# Patient Record
Sex: Female | Born: 1965 | Race: Black or African American | Hispanic: No | Marital: Married | Smoking: Never smoker
Health system: Southern US, Community
[De-identification: ages and names within clinical notes are randomized; demographics above are authoritative.]

## PROBLEM LIST (undated history)

## (undated) DIAGNOSIS — N952 Postmenopausal atrophic vaginitis: Secondary | ICD-10-CM

## (undated) DIAGNOSIS — I10 Essential (primary) hypertension: Secondary | ICD-10-CM

## (undated) HISTORY — DX: Postmenopausal atrophic vaginitis: N95.2

## (undated) HISTORY — PX: ABDOMINAL HYSTERECTOMY: SHX81

## (undated) HISTORY — DX: Essential (primary) hypertension: I10

## (undated) HISTORY — PX: ROUX-EN-Y GASTRIC BYPASS: SHX1104

## (undated) HISTORY — PX: OTHER SURGICAL HISTORY: SHX169

---

## 2014-05-19 ENCOUNTER — Ambulatory Visit: Payer: Self-pay | Admitting: *Deleted

## 2014-05-28 ENCOUNTER — Encounter: Payer: BC Managed Care – PPO | Attending: Family Medicine | Admitting: Dietician

## 2014-05-28 ENCOUNTER — Encounter: Payer: Self-pay | Admitting: Dietician

## 2014-05-28 VITALS — Ht 64.0 in | Wt 220.3 lb

## 2014-05-28 DIAGNOSIS — Z713 Dietary counseling and surveillance: Secondary | ICD-10-CM | POA: Diagnosis not present

## 2014-05-28 DIAGNOSIS — E669 Obesity, unspecified: Secondary | ICD-10-CM | POA: Diagnosis present

## 2014-05-28 DIAGNOSIS — Z9884 Bariatric surgery status: Secondary | ICD-10-CM | POA: Diagnosis not present

## 2014-05-28 NOTE — Progress Notes (Signed)
Medical Nutrition Therapy:  Appt start time: 1400 end time:  1530.  Assessment:  Primary concerns today: Tiffany Griffith is here today reporting that her "eating habits are horrible." [place] Her highest weight was 286 lbs prior to surgery. Has a strong family history of HTN. She reports a lack of support at her surgery program and "got stuck eating the same things" after surgery and returned to old habits. However, she did lose over 100 pounds. She reports that she became anxious and fearful after she lost the weight. She saw a counselor previously but has not been to one recently. Her goal is to get down to 170-180 pounds or a size 10. Tiffany Griffith reports that her husband is obese as well and is not supportive or motivating. 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 Tiffany Griffith states that she never feels full and snacks constantly.       Preferred Learning Style:  No preference indicated   Learning Readiness:  Ready   MEDICATIONS: see list   DIETARY INTAKE:  Usual eating pattern includes 3 meals and several snacks "all day". Avoided foods include ice cream.    24-hr recall:  B ( AM): 2 eggs, 2 pieces of toast, [place] bacon  Snk ( AM):  L ( PM): leftovers Snk ( PM): popcorn, potato chips, candy D ( PM): chicken, chicken salad, grilled cheese sandwich Snk ( PM):   Beverages: water with sugar free flavoring, powerade zero, sweet tea occasionally  Usual physical activity: none recently  Estimated energy needs: 1400-1600 calories 158-170 g carbohydrates 105-112 g protein 39-42 g fat  Progress Towards Goal(s):  In progress.   Nutritional Diagnosis:  [place]-3.3 Overweight/obesity As related to knowledge deficit and noncompliance regarding post op bariatric surgery diet guidelines.  As evidenced by BMI of 38 and patient report of poor eating habits.    Intervention:  Nutrition counseling provided. Discussed basic nutrition and exercise recommendations. 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 Recommended increasing protein intake and limiting carbohydrate intake.   Samples provided and patient educated on proper usage:  Premier protein shake (strawberry - qty 1) Lot #: [id number] Exp: 10/2014  Premier protein shake (chocolate - qty 1) Lot #: [id number] Exp: 11/2014   Goals: -Look into a counselor  -Talk to counselor about stress eating -Stress management: exercising, church/praying, work on Therapist, musicstuff for [employer] -Reward yourself with non-food items -Remove your "trigger foods" from the house -Get back into exercise routine  -Try using My Fitness Pal again -Try to get at least 60 grams of protein per day  -Cottage cheese, eggs, protein shakes, [place] yogurt, lean meats (baked/grilled chicken, fish)  -1 ounces of meat = 7 grams of protein  -3 ounces of meat is about the size of your palm -Avoid weighing yourself every day -Get back into taking supplements  -"Complete" multivitamin  -1500 milligrams Calcium citrate (500 mg 3x a day) - Citracal Petites   -Vitamin B12 "sublingual" (under the tongue) -Limit starches: potatoes, corn, bread, stuffing, peas, rice -Fill up on non-starchy vegetables (any vegetable that is not corn, peas, or potatoes)  -Vegetables are good raw or cooked, fresh or frozen -Choose foods that are higher in fiber and lower in sugar Breakfast:  -2 eggs, [place] bacon, and 1 piece of toast  -Low-sugar oatmeal with nuts   -Fat free Greek yogurt with whole grain cereal sprinkled  on top Lunch and dinner:  -Stir-fried vegetables and meat  -Fish/meat and vegetables  -Low-carb taco salad with ground [place] with [place] yogurt, salsa, beans, lettuce, tomatoes, and cheese (no tortilla)  -BBQ chicken nachos without the chip (or pre-portioned chips)   Snacks:  -Protein shakes  -Vegetables   -String  cheese  -Cottage cheese  -[employer] Protein bars *Refer to snack list *Keep healthy snacks readily available   Teaching Method Utilized:  Visual Auditory Hands on  Handouts given during visit include:  Counseling services  MyPlate  15g CHO + protein snacks  Phase 3A lean protein foods  Phase 3B lean protein + non-starchy vegetables  Barriers to learning/adherence to lifestyle change: lack of family support, knowledge deficit  Demonstrated degree of understanding via:  Teach Back   Monitoring/Evaluation:  Dietary intake, exercise, and body weight in 6 week(s).

## 2014-05-28 NOTE — Patient Instructions (Addendum)
-  Look into a counselor  -Talk to counselor about stress eating  -Stress management: exercising, church/praying, work on Therapist, musicstuff for flea market  -Reward yourself with non-food items  -Remove your "trigger foods" from the house  -Get back into exercise routine  -Try using My Fitness Pal again  -Try to get at least 60 grams of protein per day  -Cottage cheese, eggs, protein shakes, [place] yogurt, lean meats (baked/grilled chicken, fish)  -1 ounces of meat = 7 grams of protein  -3 ounces of meat is about the size of your palm  -Avoid weighing yourself every day  -Get back into taking supplements  -"Complete" multivitamin  -1500 milligrams Calcium citrate (500 mg 3x a day) - Citracal Petites   -Vitamin B12 "sublingual" (under the tongue)  -Limit starches: potatoes, corn, bread, stuffing, peas, rice  -Fill up on non-starchy vegetables (any vegetable that is not corn, peas, or potatoes)  -Vegetables are good raw or cooked, fresh or frozen  -Choose foods that are higher in fiber and lower in sugar  Breakfast:  -2 eggs, [place] bacon, and 1 piece of toast  -Low-sugar oatmeal with nuts   -Fat free Greek yogurt with whole grain cereal sprinkled on top  Lunch and dinner:  -Stir-fried vegetables and meat  -Fish/meat and vegetables  -Low-carb taco salad with ground [place] with [place] yogurt, salsa, beans, lettuce, tomatoes, and cheese (no tortilla)  -BBQ chicken nachos without the chip (or pre-portioned chips)    Snacks:  -Protein shakes  -Vegetables   -String cheese  -Cottage cheese  -[employer] Protein bars  *Refer to snack list *Keep healthy snacks readily available

## 2014-07-03 ENCOUNTER — Encounter: Payer: BC Managed Care – PPO | Attending: Family Medicine | Admitting: Dietician

## 2014-07-03 VITALS — Ht 64.0 in | Wt 217.0 lb

## 2014-07-03 DIAGNOSIS — Z9884 Bariatric surgery status: Secondary | ICD-10-CM | POA: Insufficient documentation

## 2014-07-03 DIAGNOSIS — Z713 Dietary counseling and surveillance: Secondary | ICD-10-CM | POA: Diagnosis not present

## 2014-07-03 DIAGNOSIS — E669 Obesity, unspecified: Secondary | ICD-10-CM | POA: Insufficient documentation

## 2014-07-03 NOTE — Progress Notes (Signed)
  Medical Nutrition Therapy:  Appt start time: 1150 end time:  1220.  Follow up:     Tiffany Griffith returns today having lost 4 pounds since her last visit 6 weeks ago. She reports that she is eating more vegetables and meat, and less carbs. Tiffany Griffith also reports more structure with eating. However, she got stressed in the last few weeks and has had some pizza and candy. She tried Premier protein shakes and did not like them. She has started taking multivitamin and calcium supplements again! Her husband is still keeping trigger foods in the house.   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 Avoided foods include ice cream.    24-hr recall:  B ( AM): 2 eggs, 1 piece toast, [place] bacon  Snk ( AM): fruit with whipped cream L ( PM): salad with chicken Snk ( PM): cheese D ( PM): baked fish with side salad Snk ( PM): yogurt with oatmeal and cottage cheese and whipped cream  Beverages: water with sugar free flavoring, powerade zero, sweet tea when she goes out to eat  Usual physical activity: none recently  Estimated energy needs: 1400-1600 calories 158-170 g carbohydrates 105-112 g protein 39-42 g fat  Progress Towards Goal(s):  In progress.   Nutritional Diagnosis:  [place]-3.3 Overweight/obesity As related to knowledge deficit and noncompliance regarding post op bariatric surgery diet guidelines.  As evidenced by BMI of 38 and patient report of poor eating habits.    Intervention: Nutrition counseling provided. Discussed basic nutrition and exercise recommendations. 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 Recommended increasing  protein intake and limiting carbohydrate intake.   Teaching Method Utilized:  Auditory  Handouts given during visit include:  Support group flyers  Barriers to learning/adherence to lifestyle change: lack of family support, knowledge deficit  Demonstrated degree of understanding via:  Teach Back   Monitoring/Evaluation:  Dietary intake, exercise, and body weight in 2 months.

## 2014-07-03 NOTE — Patient Instructions (Addendum)
-  Look into a counselor  -Talk to counselor about stress eating  -Stress management: exercising, church/praying, work on Therapist, musicstuff for flea market  -Reward yourself with non-food items  -Remove your "trigger foods" from the house  -Try Quest protein chips  -Try cutting sweet tea with 1/2 unsweet   -Get back into exercise routine  -Try using My Fitness Pal again  -Try to get at least 60 grams of protein per day  -Cottage cheese, eggs, protein shakes, [place] yogurt, lean meats (baked/grilled chicken, fish)  -1 ounces of meat = 7 grams of protein  -3 ounces of meat is about the size of your palm  -Avoid weighing yourself every day  -Limit starches: potatoes, corn, bread, stuffing, peas, rice  -Fill up on non-starchy vegetables (any vegetable that is not corn, peas, or potatoes)  -Vegetables are good raw or cooked, fresh or frozen  -Choose foods that are higher in fiber and lower in sugar  Breakfast:  -2 eggs, [place] bacon, and 1 piece of toast  -Low-sugar oatmeal with nuts   -Fat free Greek yogurt with whole grain cereal sprinkled on top  Lunch and dinner:  -Stir-fried vegetables and meat  -Fish/meat and vegetables  -Low-carb taco salad with ground [place] with [place] yogurt, salsa, beans, lettuce, tomatoes, and cheese (no tortilla)  -BBQ chicken nachos without the chip (or pre-portioned chips)    Snacks:  -Protein shakes  -Vegetables   -String cheese  -Cottage cheese  -[employer] Protein bars  *Refer to snack list *Keep healthy snacks readily available

## 2014-09-03 ENCOUNTER — Encounter: Payer: BC Managed Care – PPO | Attending: Family Medicine | Admitting: Dietician

## 2014-09-03 VITALS — Ht 64.0 in | Wt 218.5 lb

## 2014-09-03 DIAGNOSIS — Z713 Dietary counseling and surveillance: Secondary | ICD-10-CM | POA: Insufficient documentation

## 2014-09-03 DIAGNOSIS — Z6837 Body mass index (BMI) 37.0-37.9, adult: Secondary | ICD-10-CM | POA: Insufficient documentation

## 2014-09-03 DIAGNOSIS — E669 Obesity, unspecified: Secondary | ICD-10-CM | POA: Diagnosis present

## 2014-09-03 NOTE — Patient Instructions (Addendum)
-  Stress management: exercising, church/praying, work on Therapist, [employer]stuff for [employer]  -Reward yourself with non-food items  -Remove your "trigger foods" from the house  -Try Quest protein chips  -Try cutting sweet tea with 1/2 unsweet   -Get back into exercise routine  -Try using My Fitness Pal again  -Try to get at least 60 grams of protein per day  -Cottage cheese, eggs, protein shakes, [place] yogurt, lean meats (baked/grilled chicken, fish)  -1 ounces of meat = 7 grams of protein  -3 ounces of meat is about the size of your palm  -Avoid weighing yourself every day  -Limit starches: potatoes, corn, bread, stuffing, peas, rice  -Fill up on non-starchy vegetables (any vegetable that is not corn, peas, or potatoes)  -Vegetables are good raw or cooked, fresh or frozen  -Choose foods that are higher in fiber and lower in sugar  Meals: -Focus on lean meat and non-starchy vegetables  -2 eggs, [place] bacon, and 1 piece of toast   -Fat free Greek yogurt  -Cottage cheese  -Tuna/egg/chicken salad with lite mayo (add cheese for a melt)  -Stir-fried vegetables and meat  -Fish/meat and vegetables  -Low-carb taco salad with ground [place] with [place] yogurt, salsa, beans, lettuce, tomatoes, and cheese  -BBQ chicken nachos without the chip (or pre-portioned chips)    Snacks:  -Protein shakes  -Vegetables   -String cheese  -Cottage cheese  -[employer] Protein bars  *Keep healthy snacks readily available  -Avoid sweet drinks like juice and sweet tea -Try protein chips -Small bowl of popcorn with calorie-free spray butter -Pre portion your snacks -Talk to husband - ask him not to buy the chips and snacks that you like   TANITA  BODY COMP RESULTS  07/03/14 09/03/14   BMI (kg/m^2) 37.2 37.5   Fat Mass (lbs) 104 102   Fat Free Mass (lbs) 113 116.5   Total Body Water (lbs) 82.5 85.5

## 2014-09-03 NOTE — Progress Notes (Signed)
  Medical Nutrition Therapy:  Appt start time: 1205 end time:  1250  Follow up:      Tiffany Griffith returns today having lost 2 pounds of fat. She reports that she has not been eating well and has been craving carbs. She is working with Marco CollieSusan Kroll-Smith, LCSW regarding eating behaviors. Tiffany Griffith reports that she has diarrhea after breakfast (oatmeal or eggs) most days. She also states that rice and juice cause dumping syndrome.   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 Avoided foods include ice cream.    24-hr recall:  B ( AM): 2 eggs, 1 piece toast, [place] bacon  Snk ( AM): fruit with whipped cream L ( PM): salad with chicken Snk ( PM): cheese D ( PM): baked fish with side salad Snk ( PM): yogurt with oatmeal and cottage cheese and whipped cream  Beverages: water with sugar free flavoring, powerade zero, sweet tea when she goes out to eat  Usual physical activity: none recently  Estimated energy needs: 1400-1600 calories 158-170 g carbohydrates 105-112 g protein 39-42 g fat  Progress Towards Goal(s):  In progress.   Nutritional Diagnosis:  [place]-3.3 Overweight/obesity As related to knowledge deficit and noncompliance regarding post op bariatric surgery diet guidelines.  As evidenced by BMI of 38 and patient report of poor eating habits.    Intervention: Nutrition counseling provided. Discussed basic nutrition and exercise recommendations. 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 Recommended increasing protein intake and limiting carbohydrate intake.   Teaching Method Utilized:   Auditory  Handouts given during visit include:  Support group flyers  Barriers to learning/adherence to lifestyle change: lack of family support, knowledge deficit  Demonstrated degree of understanding via:  Teach Back   Monitoring/Evaluation:  Dietary intake, exercise, and body weight in 2 months.

## 2014-10-17 ENCOUNTER — Ambulatory Visit: Payer: BC Managed Care – PPO | Admitting: Dietician

## 2014-12-01 ENCOUNTER — Ambulatory Visit: Payer: BC Managed Care – PPO | Admitting: Dietician

## 2016-05-16 ENCOUNTER — Encounter: Payer: Self-pay | Admitting: Obstetrics and Gynecology

## 2016-05-16 ENCOUNTER — Telehealth: Payer: Self-pay | Admitting: Obstetrics and Gynecology

## 2016-05-16 ENCOUNTER — Ambulatory Visit (INDEPENDENT_AMBULATORY_CARE_PROVIDER_SITE_OTHER): Payer: BLUE CROSS/BLUE SHIELD | Admitting: Obstetrics and Gynecology

## 2016-05-16 VITALS — BP 102/70 | HR 68 | Resp 16 | Ht 64.0 in | Wt 227.0 lb

## 2016-05-16 DIAGNOSIS — Z01419 Encounter for gynecological examination (general) (routine) without abnormal findings: Secondary | ICD-10-CM

## 2016-05-16 DIAGNOSIS — R6882 Decreased libido: Secondary | ICD-10-CM

## 2016-05-16 DIAGNOSIS — N952 Postmenopausal atrophic vaginitis: Secondary | ICD-10-CM

## 2016-05-16 DIAGNOSIS — N898 Other specified noninflammatory disorders of vagina: Secondary | ICD-10-CM

## 2016-05-16 MED ORDER — ESTRADIOL 10 MCG VA TABS: 1.0000 | ORAL_TABLET | VAGINAL | Status: DC

## 2016-05-16 NOTE — Patient Instructions (Addendum)
EXERCISE AND DIET:  We recommended that you start or continue a regular exercise program for good health. Regular exercise means any activity that makes your heart beat faster and makes you sweat.  We recommend exercising at least 30 minutes per day at least 3 days a week, preferably 4 or 5.  We also recommend a diet low in fat and sugar.  Inactivity, poor dietary choices and obesity can cause diabetes, heart attack, stroke, and kidney damage, among others.    ALCOHOL AND SMOKING:  Women should limit their alcohol intake to no more than 7 drinks/beers/glasses of wine (combined, not each!) per week. Moderation of alcohol intake to this level decreases your risk of breast cancer and liver damage. And of course, no recreational drugs are part of a healthy lifestyle.  And absolutely no smoking or even second hand smoke. Most people know smoking can cause heart and lung diseases, but did you know it also contributes to weakening of your bones? Aging of your skin?  Yellowing of your teeth and nails?  CALCIUM AND VITAMIN D:  Adequate intake of calcium and Vitamin D are recommended.  The recommendations for exact amounts of these supplements seem to change often, but generally speaking 600 mg of calcium (either carbonate or citrate) and 800 units of Vitamin D per day seems prudent. Certain women may benefit from higher intake of Vitamin D.  If you are among these women, your doctor will have told you during your visit.    PAP SMEARS:  Pap smears, to check for cervical cancer or precancers,  have traditionally been done yearly, although recent scientific advances have shown that most women can have pap smears less often.  However, every woman still should have a physical exam from her gynecologist every year. It will include a breast check, inspection of the vulva and vagina to check for abnormal growths or skin changes, a visual exam of the cervix, and then an exam to evaluate the size and shape of the uterus and  ovaries.  And after 49 years of age, a rectal exam is indicated to check for rectal cancers. We will also provide age appropriate advice regarding health maintenance, like when you should have certain vaccines, screening for sexually transmitted diseases, bone density testing, colonoscopy, mammograms, etc.   MAMMOGRAMS:  All women over 40 years old should have a yearly mammogram. Many facilities now offer a "3D" mammogram, which may cost around $50 extra out of pocket. If possible,  we recommend you accept the option to have the 3D mammogram performed.  It both reduces the number of women who will be called back for extra views which then turn out to be normal, and it is better than the routine mammogram at detecting truly abnormal areas.    COLONOSCOPY:  Colonoscopy to screen for colon cancer is recommended for all women at age 50.  We know, you hate the idea of the prep.  We agree, BUT, having colon cancer and not knowing it is worse!!  Colon cancer so often starts as a polyp that can be seen and removed at colonscopy, which can quite literally save your life!  And if your first colonoscopy is normal and you have no family history of colon cancer, most women don't have to have it again for 10 years.  Once every ten years, you can do something that may end up saving your life, right?  We will be happy to help you get it scheduled when you are ready.    Be sure to check your insurance coverage so you understand how much it will cost.  It may be covered as a preventative service at no cost, but you should check your particular policy.      Breast Self-Awareness Practicing breast self-awareness may pick up problems early, prevent significant medical complications, and possibly save your life. By practicing breast self-awareness, you can become familiar with how your breasts look and feel and if your breasts are changing. This allows you to notice changes early. It can also offer you some reassurance that your  breast health is good. One way to learn what is normal for your breasts and whether your breasts are changing is to do a breast self-exam. If you find a lump or something that was not present in the past, it is best to contact your caregiver right away. Other findings that should be evaluated by your caregiver include nipple discharge, especially if it is bloody; skin changes or reddening; areas where the skin seems to be pulled in (retracted); or new lumps and bumps. Breast pain is seldom associated with cancer (malignancy), but should also be evaluated by a caregiver. HOW TO PERFORM A BREAST SELF-EXAM The best time to examine your breasts is 5-7 days after your menstrual period is over. During menstruation, the breasts are lumpier, and it may be more difficult to pick up changes. If you do not menstruate, have reached menopause, or had your uterus removed (hysterectomy), you should examine your breasts at regular intervals, such as monthly. If you are breastfeeding, examine your breasts after a feeding or after using a breast pump. Breast implants do not decrease the risk for lumps or tumors, so continue to perform breast self-exams as recommended. Talk to your caregiver about how to determine the difference between the implant and breast tissue. Also, talk about the amount of pressure you should use during the exam. Over time, you will become more familiar with the variations of your breasts and more comfortable with the exam. A breast self-exam requires you to remove all your clothes above the waist. 1. Look at your breasts and nipples. Stand in front of a mirror in a room with good lighting. With your hands on your hips, push your hands firmly downward. Look for a difference in shape, contour, and size from one breast to the other (asymmetry). Asymmetry includes puckers, dips, or bumps. Also, look for skin changes, such as reddened or scaly areas on the breasts. Look for nipple changes, such as discharge,  dimpling, repositioning, or redness. 2. Carefully feel your breasts. This is best done either in the shower or tub while using soapy water or when flat on your back. Place the arm (on the side of the breast you are examining) above your head. Use the pads (not the fingertips) of your three middle fingers on your opposite hand to feel your breasts. Start in the underarm area and use  inch (2 cm) overlapping circles to feel your breast. Use 3 different levels of pressure (light, medium, and firm pressure) at each circle before moving to the next circle. The light pressure is needed to feel the tissue closest to the skin. The medium pressure will help to feel breast tissue a little deeper, while the firm pressure is needed to feel the tissue close to the ribs. Continue the overlapping circles, moving downward over the breast until you feel your ribs below your breast. Then, move one finger-width towards the center of the body. Continue to use the    inch (2 cm) overlapping circles to feel your breast as you move slowly up toward the collar bone (clavicle) near the base of the neck. Continue the up and down exam using all 3 pressures until you reach the middle of the chest. Do this with each breast, carefully feeling for lumps or changes. 3.  Keep a written record with breast changes or normal findings for each breast. By writing this information down, you do not need to depend only on memory for size, tenderness, or location. Write down where you are in your menstrual cycle, if you are still menstruating. Breast tissue can have some lumps or thick tissue. However, see your caregiver if you find anything that concerns you.  SEEK MEDICAL CARE IF:  You see a change in shape, contour, or size of your breasts or nipples.   You see skin changes, such as reddened or scaly areas on the breasts or nipples.   You have an unusual discharge from your nipples.   You feel a new lump or unusually thick areas.     This information is not intended to replace advice given to you by your health care provider. Make sure you discuss any questions you have with your health care provider.   Document Released: 11/14/2005 Document Revised: 10/31/2012 Document Reviewed: 02/29/2012 [employer] Interactive Patient Education 2016 [employer]  

## 2016-05-16 NOTE — Progress Notes (Signed)
50 y.o. [id number] MarriedAfrican AmericanF here for annual exam.  She c/o vaginal soreness and irritation for the last few weeks, burning. No increased vaginal d/c. She does notice as odor. She is sexually active, only uncomfortable in the last few weeks. In the past she used the vagifem and it helped, the generic didn't help. Uses a lubricant with intercourse and it helps.  She had a TAH and ?BSO in 2005. She had 7 surgeries including the hysterectomy for complications from the hysterectomy. She has never had any vasomotor symptoms, just vaginal dryness.     Patient's last menstrual period was 04/28/2004.          Sexually active: Yes.    The current method of family planning is status post hysterectomy.    Exercising: No.  The patient does not participate in regular exercise at present. Smoker:  no  Health Maintenance: Pap:  2016 WNL History of abnormal Pap:  no MMG:  Never Colonoscopy:  Never BMD:   Never TDaP:  Years ago Gardasil: N/A   reports that she has never smoked. She has never used smokeless tobacco. She reports that she does not drink alcohol or use illicit drugs. She is a caregiver. 2 grown children, one in [place] one in [place]. 3 grandchildren, range from 1-11.   Past Medical History  Diagnosis Date  . Hypertension     Past Surgical History  Procedure Laterality Date  . Abdominal hysterectomy    . Roux-en-y gastric bypass    . Achilles    . Gallstone tubulation      Current Outpatient Prescriptions  Medication Sig Dispense Refill  . calcium-vitamin D (OSCAL WITH D) 250-125 MG-UNIT per tablet Take 1 tablet by mouth daily.    Marland Kitchen. docusate sodium (COLACE) 100 MG capsule Take 100 mg by mouth 2 (two) times daily.    . ferrous sulfate 325 (65 FE) MG tablet Take 325 mg by mouth daily with breakfast.    . hydrochlorothiazide (HYDRODIURIL) 25 MG tablet TK 1 T PO QAM  0  . meloxicam (MOBIC) 15 MG tablet TK 1 T PO QD WF  1  . Multiple Vitamins-Minerals (MULTIVITAMIN WITH MINERALS)  tablet Take 1 tablet by mouth daily.    Marland Kitchen. PREMARIN vaginal cream U 0.5 GRAM VAGINALLY 2 TIMES A WK  1   No current facility-administered medications for this visit.    Family History  Problem Relation Age of Onset  . Hypertension Other   . Sleep apnea Other   . Hypertension Mother   . Hypertension Father   . Hypertension Sister     Review of Systems  Constitutional: Negative.   HENT: Negative.   Eyes: Negative.   Respiratory: Negative.   Cardiovascular: Negative.   Gastrointestinal: Negative.   Endocrine: Negative.   Genitourinary: Negative.   Musculoskeletal: Negative.   Skin: Negative.   Allergic/Immunologic: Negative.   Neurological: Negative.   Hematological: Negative.   Psychiatric/Behavioral: Negative.     Exam:   BP 102/70 mmHg  Pulse 68  Resp 16  Ht 5\' 4"  (1.626 m)  Wt 227 lb (102.967 kg)  BMI 38.95 kg/m2  LMP 04/28/2004  Weight change: @WEIGHTCHANGE @ Height:   Height: 5\' 4"  (162.6 cm)  Ht Readings from Last 3 Encounters:  05/16/16 5\' 4"  (1.626 m)  09/03/14 5\' 4"  (1.626 m)  07/03/14 5\' 4"  (1.626 m)    General appearance: alert, cooperative and appears stated age Head: Normocephalic, without obvious abnormality, atraumatic Neck: no adenopathy, supple, symmetrical, trachea midline  and thyroid normal to inspection and palpation Lungs: clear to auscultation bilaterally Breasts: normal appearance, no masses or tenderness Heart: regular rate and rhythm Abdomen: soft, non-tender; bowel sounds normal; no masses,  no organomegaly Extremities: extremities normal, atraumatic, no cyanosis or edema Skin: Skin color, texture, turgor normal. No rashes or lesions Lymph nodes: Cervical, supraclavicular, and axillary nodes normal. No abnormal inguinal nodes palpated Neurologic: Grossly normal   Pelvic: External genitalia:  no lesions              Urethra:  normal appearing urethra with no masses, tenderness or lesions              Bartholins and Skenes: normal                  Vagina: normal appearing vagina with mild atrophy, normal color and discharge, no lesions              Cervix: absent               Bimanual Exam:  Uterus:  uterus absent              Adnexa: no mass, fullness, tenderness               Rectovaginal: Confirms               Anus:  normal sphincter tone, no lesions  Chaperone was present for exam.  A:  Well Woman with normal exam  Vaginal irritation  Vaginal atrophy  Low libido   P:   No pap this year  Re start vagifem (brand only)  Testosterone levels  Discussed breast self exam  Discussed calcium and vit D intake  Primary checks yearly labs

## 2016-05-16 NOTE — Telephone Encounter (Signed)
Patient left with out signing a medical release of records for all surgeries 2005, hysterectomy and surgeries for complications. I left patient a message that I was mailing the release to her home if she would complete and mail or drop off form.

## 2016-05-17 ENCOUNTER — Telehealth: Payer: Self-pay | Admitting: *Deleted

## 2016-05-17 LAB — TESTOSTERONE, TOTAL, LC/MS/MS: Testosterone, Total, LC-MS-MS: 22 ng/dL (ref 2–45)

## 2016-05-17 LAB — WET PREP BY MOLECULAR PROBE
Candida species: NEGATIVE
Gardnerella vaginalis: POSITIVE — AB
Trichomonas vaginosis: NEGATIVE

## 2016-05-17 MED ORDER — METRONIDAZOLE 500 MG PO TABS: 500.0000 mg | ORAL_TABLET | Freq: Two times a day (BID) | ORAL | Status: DC

## 2016-05-17 NOTE — Telephone Encounter (Signed)
-----   Message from Romualdo BolkJill Evelyn Jertson, MD sent at 05/17/2016 10:25 AM EDT ----- Please inform the patient that her vaginitis probe was + for BV and treat with flagyl (either oral or vaginal, her choice), no ETOH while on Flagyl.  Oral: Flagyl 500 mg BID x 7 days, or Vaginal: Metrogel, 1 applicator per vagina q day x 5 days.

## 2016-05-17 NOTE — Telephone Encounter (Signed)
Spoke with patient and gave lab results. I sent in Flagyl per patients request. I also informed her to not drink any alcohol while taking it. Patient voiced understanding -eh

## 2016-05-17 NOTE — Telephone Encounter (Signed)
Let message to call back in regards to labs results-eh

## 2016-05-17 NOTE — Telephone Encounter (Signed)
Patient returning call.

## 2016-05-20 LAB — TESTOSTERONE, FREE, LC/MS/MS: Testosterone, Free, LCM/MS/MS: 1.4 pg/mL (ref 0.2–5.0)

## 2019-06-11 ENCOUNTER — Other Ambulatory Visit: Payer: Self-pay

## 2019-06-11 ENCOUNTER — Ambulatory Visit: Payer: BC Managed Care – PPO | Admitting: Sports Medicine

## 2019-06-11 ENCOUNTER — Encounter: Payer: Self-pay | Admitting: Sports Medicine

## 2019-06-11 VITALS — BP 130/86 | Ht 64.0 in | Wt 218.0 lb

## 2019-06-11 DIAGNOSIS — M722 Plantar fascial fibromatosis: Secondary | ICD-10-CM

## 2019-06-11 NOTE — Patient Instructions (Addendum)
  You want to ask your insurance if they will cover the cost of custom orthotics for your heel spurs.  If not, your cost out-of-pocket will be around $190.

## 2019-06-11 NOTE — Progress Notes (Signed)
   Subjective:    Patient ID: Tiffany Griffith, female    DOB: 1966-06-28, 53 y.o.   MRN: [id number]  HPI chief complaint: Bilateral foot pain  Very pleasant 53 year old female sent over the request of Dr. Percell Miller for evaluation for custom orthotics.  She has longstanding heel pain secondary to plantar fasciitis and heel spurs.  She has had custom orthotics in the past.  They are quite old.  She localizes all of her pain to the plantar aspect of her heels, left greater than right.  She has not noticed any swelling.  No numbness or tingling.  Past medical history reviewed Medications reviewed Allergies reviewed   Review of Systems    As above Objective:   Physical Exam  Well-developed, well-nourished.  No acute distress.  Awake alert and oriented x3.  Vital signs reviewed  Evaluation of her feet in the standing position shows a fairly neutral arch.  She is tender to palpation at the calcaneal origin of the plantar fascia on the left.  No soft tissue swelling.  Minimal tenderness to palpation at the calcaneal origin of the plantar fascia on the right.  Negative calcaneal squeeze bilaterally.  Good pulses.  Sensation is intact to light touch distally.  Walking without significant limp.      Assessment & Plan:   Chronic bilateral foot pain, left greater than right, secondary to plantar fasciitis and heel spurring  I think the patient would benefit from custom orthotics.  She would like to check with her insurance company about cost first.  The tennis shoes that she brought with her today will not accommodate a regular sports orthotic but will likely accommodate a dress orthotic.  She does have a second pair of tennis shoes at home and she will bring those with her to her follow-up visit.  We will decide at that time whether or not to proceed with dress custom orthotics or the more traditional well cushioned sports orthotic.  This note was dictated using Dragon naturally speaking software and  may contain errors in syntax, spelling, or content which have not been identified prior to signing this note.

## 2020-01-03 ENCOUNTER — Encounter: Payer: Self-pay | Admitting: *Deleted

## 2020-01-03 ENCOUNTER — Encounter: Payer: Self-pay | Admitting: Cardiology

## 2020-01-03 ENCOUNTER — Telehealth (HOSPITAL_COMMUNITY): Payer: Self-pay

## 2020-01-03 ENCOUNTER — Other Ambulatory Visit (HOSPITAL_COMMUNITY)
Admission: RE | Admit: 2020-01-03 | Discharge: 2020-01-03 | Disposition: A | Discharge status: Home / Self Care | Payer: BC Managed Care – PPO | Source: Ambulatory Visit | Attending: Internal Medicine | Admitting: Internal Medicine

## 2020-01-03 ENCOUNTER — Ambulatory Visit (INDEPENDENT_AMBULATORY_CARE_PROVIDER_SITE_OTHER): Payer: BC Managed Care – PPO | Admitting: Cardiology

## 2020-01-03 ENCOUNTER — Other Ambulatory Visit: Payer: Self-pay

## 2020-01-03 VITALS — BP 110/84 | HR 75 | Ht 64.0 in | Wt 227.0 lb

## 2020-01-03 DIAGNOSIS — R079 Chest pain, unspecified: Secondary | ICD-10-CM

## 2020-01-03 DIAGNOSIS — E669 Obesity, unspecified: Secondary | ICD-10-CM | POA: Diagnosis not present

## 2020-01-03 DIAGNOSIS — E559 Vitamin D deficiency, unspecified: Secondary | ICD-10-CM | POA: Insufficient documentation

## 2020-01-03 DIAGNOSIS — Z20822 Contact with and (suspected) exposure to covid-19: Secondary | ICD-10-CM | POA: Diagnosis not present

## 2020-01-03 DIAGNOSIS — Z13220 Encounter for screening for lipoid disorders: Secondary | ICD-10-CM

## 2020-01-03 DIAGNOSIS — K219 Gastro-esophageal reflux disease without esophagitis: Secondary | ICD-10-CM | POA: Insufficient documentation

## 2020-01-03 DIAGNOSIS — F419 Anxiety disorder, unspecified: Secondary | ICD-10-CM

## 2020-01-03 DIAGNOSIS — F430 Acute stress reaction: Secondary | ICD-10-CM | POA: Insufficient documentation

## 2020-01-03 DIAGNOSIS — I10 Essential (primary) hypertension: Secondary | ICD-10-CM | POA: Insufficient documentation

## 2020-01-03 DIAGNOSIS — Z01812 Encounter for preprocedural laboratory examination: Secondary | ICD-10-CM | POA: Insufficient documentation

## 2020-01-03 DIAGNOSIS — R9431 Abnormal electrocardiogram [ECG] [EKG]: Secondary | ICD-10-CM

## 2020-01-03 DIAGNOSIS — E538 Deficiency of other specified B group vitamins: Secondary | ICD-10-CM | POA: Insufficient documentation

## 2020-01-03 DIAGNOSIS — R0602 Shortness of breath: Secondary | ICD-10-CM

## 2020-01-03 DIAGNOSIS — Z9884 Bariatric surgery status: Secondary | ICD-10-CM | POA: Insufficient documentation

## 2020-01-03 DIAGNOSIS — M1712 Unilateral primary osteoarthritis, left knee: Secondary | ICD-10-CM | POA: Insufficient documentation

## 2020-01-03 DIAGNOSIS — R072 Precordial pain: Secondary | ICD-10-CM

## 2020-01-03 DIAGNOSIS — N952 Postmenopausal atrophic vaginitis: Secondary | ICD-10-CM

## 2020-01-03 HISTORY — DX: Unilateral primary osteoarthritis, left knee: M17.12

## 2020-01-03 HISTORY — DX: Morbid (severe) obesity due to excess calories: E66.01

## 2020-01-03 HISTORY — DX: Essential (primary) hypertension: I10

## 2020-01-03 HISTORY — DX: Postmenopausal atrophic vaginitis: N95.2

## 2020-01-03 HISTORY — DX: Gastro-esophageal reflux disease without esophagitis: K21.9

## 2020-01-03 HISTORY — DX: Acute stress reaction: F43.0

## 2020-01-03 HISTORY — DX: Anxiety disorder, unspecified: F41.9

## 2020-01-03 HISTORY — DX: Bariatric surgery status: Z98.84

## 2020-01-03 HISTORY — DX: Vitamin D deficiency, unspecified: E55.9

## 2020-01-03 HISTORY — DX: Deficiency of other specified B group vitamins: E53.8

## 2020-01-03 LAB — SARS CORONAVIRUS 2 (TAT 6-24 HRS): SARS Coronavirus 2: NEGATIVE

## 2020-01-03 MED ORDER — NITROGLYCERIN 0.4 MG SL SUBL: 0.4000 mg | SUBLINGUAL_TABLET | SUBLINGUAL | 3 refills | Status: AC | PRN

## 2020-01-03 NOTE — Progress Notes (Addendum)
Cardiology Office Note:    Date:  01/03/2020   ID:  Tiffany Griffith, DOB [birth year], MRN [id number]  PCP:  Sigmund Hazel, MD  Cardiologist:  Thomasene Ripple, DO  Electrophysiologist:  None   Referring MD: Sigmund Hazel, MD   The patient complains of chest pain  History of Present Illness:    Tiffany Griffith is a 54 y.o. female with a hx of hypertension, vitamin D deficiency, obesity family history of premature coronary artery disease (father died at age  76, MI) was referred by her primary care physician to be evaluated for chest pain.  The patient tells me that she has been experiencing intermittent left-sided chest pain.  She describes as a heaviness sensation which usually is diffuse over her chest area and at times radiates to her left shoulder and down her left arm.  She admits to mild shortness of breath.  She notes that the pain can last anywhere from 5 to 15 minutes. She has not experienced such pain in the past  I independently reviewed the records from patient's primary care provider office.  Past Medical History:  Diagnosis Date  . Anxiety 01/03/2020  . Atrophic vaginitis   . B12 deficiency 01/03/2020  . Bariatric surgery status 01/03/2020  . Essential hypertension 01/03/2020  . Hypertension   . Mild acid reflux 01/03/2020  . Morbid obesity (HCC) 01/03/2020  . Postmenopausal atrophic vaginitis 01/03/2020  . Primary osteoarthritis of left knee 01/03/2020  . Stress reaction 01/03/2020  . Vitamin D deficiency 01/03/2020    Past Surgical History:  Procedure Laterality Date  . ABDOMINAL HYSTERECTOMY    . achilles    . gallstone tubulation    . ROUX-EN-Y GASTRIC BYPASS      Current Medications: Current Meds  Medication Sig  . calcium-vitamin D (OSCAL WITH D) 250-125 MG-UNIT per tablet Take 1 tablet by mouth daily.  Marland Kitchen docusate sodium (COLACE) 100 MG capsule Take 100 mg by mouth 2 (two) times daily.  . ferrous sulfate 325 (65 FE) MG tablet Take 325 mg by mouth daily with breakfast.  .  hydrochlorothiazide (HYDRODIURIL) 25 MG tablet TK 1 T PO QAM  . mirtazapine (REMERON) 15 MG tablet Take 15 mg by mouth at bedtime.  . Multiple Vitamins-Minerals (MULTIVITAMIN WITH MINERALS) tablet Take 1 tablet by mouth daily.  Marland Kitchen omeprazole (PRILOSEC) 20 MG capsule Take 20 mg by mouth daily.  Marland Kitchen PATADAY 0.2 % SOLN Apply 1 drop to eye every morning.  . RESTASIS 0.05 % ophthalmic emulsion 1 drop 2 (two) times daily.  Marland Kitchen spironolactone (ALDACTONE) 25 MG tablet Take 25 mg by mouth daily.  Marland Kitchen triamcinolone ointment (KENALOG) 0.5 % Apply 1 application topically 2 (two) times daily.  . vitamin B-12 (CYANOCOBALAMIN) 100 MCG tablet Take 100 mcg by mouth daily.  . [DISCONTINUED] Lactobacillus (PROBIOTIC ACIDOPHILUS PO) Take by mouth.     Allergies:   Penicillins   Social History   Socioeconomic History  . Marital status: Married    Spouse name: Not on file  . Number of children: Not on file  . Years of education: Not on file  . Highest education level: Not on file  Occupational History  . Not on file  Tobacco Use  . Smoking status: Never Smoker  . Smokeless tobacco: Never Used  Substance and Sexual Activity  . Alcohol use: No    Alcohol/week: 0.0 standard drinks  . Drug use: No  . Sexual activity: Yes    Partners: Male    Birth control/protection: Surgical  Other Topics Concern  . Not on file  Social History Narrative  . Not on file   Social Determinants of Health   Financial Resource Strain:   . Difficulty of Paying Living Expenses: Not on file  Food Insecurity:   . Worried About Programme researcher, broadcasting/film/video in the Last Year: Not on file  . Ran Out of Food in the Last Year: Not on file  Transportation Needs:   . Lack of Transportation (Medical): Not on file  . Lack of Transportation (Non-Medical): Not on file  Physical Activity:   . Days of Exercise per Week: Not on file  . Minutes of Exercise per Session: Not on file  Stress:   . Feeling of Stress : Not on file  Social Connections:    . Frequency of Communication with Friends and Family: Not on file  . Frequency of Social Gatherings with Friends and Family: Not on file  . Attends Religious Services: Not on file  . Active Member of Clubs or Organizations: Not on file  . Attends Banker Meetings: Not on file  . Marital Status: Not on file     Family History: The patient's family history includes Hypertension in her father, mother, sister, and another family member; Sleep apnea in an other family member.  ROS:   Review of Systems  Constitution: Negative for decreased appetite, fever and weight gain.  HENT: Negative for congestion, ear discharge, hoarse voice and sore throat.   Eyes: Negative for discharge, redness, vision loss in right eye and visual halos.  Cardiovascular:Reports chest pain and shortness of breath. Negative for orthopnea and palpitations.  Respiratory: Negative for cough, hemoptysis, shortness of breath and snoring.   Endocrine: Negative for heat intolerance and polyphagia.  Hematologic/Lymphatic: Negative for bleeding problem. Does not bruise/bleed easily.  Skin: Negative for flushing, nail changes, rash and suspicious lesions.  Musculoskeletal: Negative for arthritis, joint pain, muscle cramps, myalgias, neck pain and stiffness.  Gastrointestinal: Negative for abdominal pain, bowel incontinence, diarrhea and excessive appetite.  Genitourinary: Negative for decreased libido, genital sores and incomplete emptying.  Neurological: Negative for brief paralysis, focal weakness, headaches and loss of balance.  Psychiatric/Behavioral: Negative for altered mental status, depression and suicidal ideas.  Allergic/Immunologic: Negative for HIV exposure and persistent infections.    EKGs/Labs/Other Studies Reviewed:    The following studies were reviewed today:   EKG:  The ekg ordered today demonstrates sinus rhythm, heart rate 72 bpm with T wave inversion in the lateral leads suggesting  anterolateral  Recent Labs: BMP reviewed from PCP office performed on October 10, 2018 is as follows glucose 80, BUN 15, creatinine 0.90, GFR 66, sodium 141, potassium 3.6, chloride 99, bicarb 33, calcium 9.7 Vitamin D 31.6  Recent Lipid Panel Lipid profile PCP office on October 10, 2018 reviewed: Total cholesterol 170, HDL 49, triglyceride 135 LDL 84  Physical Exam:    VS:  BP 110/84 (BP Location: Right Arm, Patient Position: Sitting, Cuff Size: Large)   Pulse 75   Ht 5\' 4"  (1.626 m)   Wt 227 lb (103 kg)   LMP 04/28/2004   SpO2 95%   BMI 38.96 kg/m     Wt Readings from Last 3 Encounters:  01/03/20 227 lb (103 kg)  06/11/19 218 lb (98.9 kg)  05/16/16 227 lb (103 kg)    GEN: Well nourished, well developed in no acute distress HEENT: Normal NECK: No JVD; No carotid bruits LYMPHATICS: No lymphadenopathy CARDIAC: S1S2 noted,RRR, no murmurs, rubs, gallops  RESPIRATORY:  Clear to auscultation without rales, wheezing or rhonchi  ABDOMEN: Soft, non-tender, non-distended, +bowel sounds, no guarding. EXTREMITIES: No edema, No cyanosis, no clubbing MUSCULOSKELETAL:  No deformity  SKIN: Warm and dry NEUROLOGIC:  Alert and oriented x 3, non-focal PSYCHIATRIC:  Normal affect, good insight  ASSESSMENT:    1. Chest pain of uncertain etiology   2. Essential hypertension   3. Obesity (BMI 30-39.9)   4. Precordial pain   5. Shortness of breath   6. Lipid screening   7. Abnormal EKG    PLAN:     1.  Her chest pain symptoms are concerning given the patient risk factors in addition her EKG is abnormal.  At this time I discussed with patient for exercise nuclear stress test.  She is agreeable to proceed with this testing.  Sublingual nitroglycerin prescription was sent, its protocol and 911 protocol explained and the patient vocalized understanding questions were answered to the patient's satisfaction  2.  Shortness of breath transthoracic echocardiogram will be appropriate to assess LV  and RV function as well as any valvular abnormalities.  3.  Hypertension-blood pressure is acceptable in the office about continue patient on her current medication regimen.  4.  Obesity the patient understands the need to lose weight with diet and exercise. We have discussed specific strategies for this.  5.  Blood work to include BMP, mag, and lipid profile will be scheduled for another day.  Spent a total of 40 minutes in the today including review of prior records, office visit and education.  The patient is in agreement with the above plan. The patient left the office in stable condition.  The patient will follow up in 3 months.   Medication Adjustments/Labs and Tests Ordered: Current medicines are reviewed at length with the patient today.  Concerns regarding medicines are outlined above.  Orders Placed This Encounter  Procedures  . Basic Metabolic Panel (BMET)  . Magnesium  . Lipid Profile  . MYOCARDIAL PERFUSION IMAGING  . EKG 12-Lead  . ECHOCARDIOGRAM COMPLETE   Meds ordered this encounter  Medications  . nitroGLYCERIN (NITROSTAT) 0.4 MG SL tablet    Sig: Place 1 tablet (0.4 mg total) under the tongue every 5 (five) minutes as needed.    Dispense:  30 tablet    Refill:  3    Patient Instructions  Medication Instructions:  Your physician has recommended you make the following change in your medication:   Nitroglycerin 0.4 mg sublingual (under your tongue) as needed for chest pain. If experiencing chest pain, stop what you are doing and sit down. Take 1 nitroglycerin and wait 5 minutes. If chest pain continues, take another nitroglycerin and wait 5 minutes. If chest pain does not subside, take 1 more nitroglycerin and dial 911. You make take a total of 3 nitroglycerin in a 15 minute time frame.   *If you need a refill on your cardiac medications before your next appointment, please call your pharmacy*  Lab Work: Your physician recommends that you return for lab work  in: TODAY BMP,MAgnesium  BEFORE NEXT VISIT: Fasting Lipid  If you have labs (blood work) drawn today and your tests are completely normal, you will receive your results only by: Marland Kitchen MyChart Message (if you have MyChart) OR . A paper copy in the mail If you have any lab test that is abnormal or we need to change your treatment, we will call you to review the results.  Testing/Procedures: Your physician has requested that  you have an echocardiogram. Echocardiography is a painless test that uses sound waves to create images of your heart. It provides your doctor with information about the size and shape of your heart and how well your heart's chambers and valves are working. This procedure takes approximately one hour. There are no restrictions for this procedure.  Your physician has requested that you have en exercise stress myoview. For further information please visit [email or web address] Please follow instruction sheet, as given.  YOU WILL BE CALL FOR A COVID SCREEN PRIOR TO TESTING  Follow-Up: At [facility], you and your health needs are our priority.  As part of our continuing mission to provide you with exceptional heart care, we have created designated Provider Care Teams.  These Care Teams include your primary Cardiologist (physician) and Advanced Practice Providers (APPs -  Physician Assistants and Nurse Practitioners) who all work together to provide you with the care you need, when you need it.  Your next appointment:   3 month(s)  The format for your next appointment:   In Person  Provider:   Thomasene Ripple, DO  Other Instructions      Adopting a Healthy Lifestyle.  Know what a healthy weight is for you (roughly BMI <25) and aim to maintain this   Aim for 7+ servings of fruits and vegetables daily   65-80+ fluid ounces of water or unsweet tea for healthy kidneys   Limit to max 1 drink of alcohol per day; avoid smoking/tobacco   Limit animal fats in diet for  cholesterol and heart health - choose grass fed whenever available   Avoid highly processed foods, and foods high in saturated/trans fats   Aim for low stress - take time to unwind and care for your mental health   Aim for 150 min of moderate intensity exercise weekly for heart health, and weights twice weekly for bone health   Aim for 7-9 hours of sleep daily   When it comes to diets, agreement about the perfect plan isnt easy to find, even among the experts. Experts at the [facility] [employer] developed an idea known as the Healthy Eating Plate. Just imagine a plate divided into logical, healthy portions.   The emphasis is on diet quality:   Load up on vegetables and fruits - one-half of your plate: Aim for color and variety, and remember that potatoes dont count.   Go for whole grains - one-quarter of your plate: Whole wheat, barley, wheat berries, quinoa, oats, brown rice, and foods made with them. If you want pasta, go with whole wheat pasta.   Protein power - one-quarter of your plate: Fish, chicken, beans, and nuts are all healthy, versatile protein sources. Limit red meat.   The diet, however, does go beyond the plate, offering a few other suggestions.   Use healthy plant oils, such as olive, canola, soy, corn, sunflower and peanut. Check the labels, and avoid partially hydrogenated oil, which have unhealthy trans fats.   If youre thirsty, drink water. Coffee and tea are good in moderation, but skip sugary drinks and limit milk and dairy products to one or two daily servings.   The type of carbohydrate in the diet is more important than the amount. Some sources of carbohydrates, such as vegetables, fruits, whole grains, and beans-are healthier than others.   Finally, stay active  Signed, Thomasene Ripple, DO  01/03/2020 9:14 AM    [facility]

## 2020-01-03 NOTE — Patient Instructions (Addendum)
Medication Instructions:  Your physician has recommended you make the following change in your medication:   Nitroglycerin 0.4 mg sublingual (under your tongue) as needed for chest pain. If experiencing chest pain, stop what you are doing and sit down. Take 1 nitroglycerin and wait 5 minutes. If chest pain continues, take another nitroglycerin and wait 5 minutes. If chest pain does not subside, take 1 more nitroglycerin and dial 911. You make take a total of 3 nitroglycerin in a 15 minute time frame.   *If you need a refill on your cardiac medications before your next appointment, please call your pharmacy*  Lab Work: Your physician recommends that you return for lab work in: TODAY BMP,MAgnesium  BEFORE NEXT VISIT: Fasting Lipid  If you have labs (blood work) drawn today and your tests are completely normal, you will receive your results only by: Marland Kitchen MyChart Message (if you have MyChart) OR . A paper copy in the mail If you have any lab test that is abnormal or we need to change your treatment, we will call you to review the results.  Testing/Procedures: Your physician has requested that you have an echocardiogram. Echocardiography is a painless test that uses sound waves to create images of your heart. It provides your doctor with information about the size and shape of your heart and how well your heart's chambers and valves are working. This procedure takes approximately one hour. There are no restrictions for this procedure.  Your physician has requested that you have en exercise stress myoview. For further information please visit [email or web address] Please follow instruction sheet, as given.  YOU WILL BE CALL FOR A COVID SCREEN PRIOR TO TESTING  Follow-Up: At [facility], you and your health needs are our priority.  As part of our continuing mission to provide you with exceptional heart care, we have created designated Provider Care Teams.  These Care Teams include your primary  Cardiologist (physician) and Advanced Practice Providers (APPs -  Physician Assistants and Nurse Practitioners) who all work together to provide you with the care you need, when you need it.  Your next appointment:   3 month(s)  The format for your next appointment:   In Person  Provider:   Thomasene Ripple, DO  Other Instructions

## 2020-01-03 NOTE — Telephone Encounter (Signed)
Encounter complete. 

## 2020-01-04 LAB — BASIC METABOLIC PANEL
BUN/Creatinine Ratio: 11 (ref 9–23)
BUN: 12 mg/dL (ref 6–24)
CO2: 27 mmol/L (ref 20–29)
Calcium: 9.9 mg/dL (ref 8.7–10.2)
Chloride: 99 mmol/L (ref 96–106)
Creatinine, Ser: 1.05 mg/dL — ABNORMAL HIGH (ref 0.57–1.00)
GFR calc Af Amer: 71 mL/min/{1.73_m2} (ref >59)
GFR calc non Af Amer: 61 mL/min/{1.73_m2} (ref >59)
Glucose: 123 mg/dL — ABNORMAL HIGH (ref 65–99)
Potassium: 3.6 mmol/L (ref 3.5–5.2)
Sodium: 140 mmol/L (ref 134–144)

## 2020-01-04 LAB — MAGNESIUM: Magnesium: 1.7 mg/dL (ref 1.6–2.3)

## 2020-01-07 ENCOUNTER — Ambulatory Visit (HOSPITAL_COMMUNITY)
Admission: RE | Admit: 2020-01-07 | Discharge: 2020-01-07 | Disposition: A | Discharge status: Home / Self Care | Payer: BC Managed Care – PPO | Source: Ambulatory Visit | Attending: Internal Medicine | Admitting: Internal Medicine

## 2020-01-07 ENCOUNTER — Other Ambulatory Visit: Payer: Self-pay

## 2020-01-07 DIAGNOSIS — R072 Precordial pain: Secondary | ICD-10-CM | POA: Diagnosis present

## 2020-01-07 LAB — MYOCARDIAL PERFUSION IMAGING
Estimated workload: 10.3 METS
Exercise duration (min): 9 min
Exercise duration (sec): 36 s
LV dias vol: 83 mL (ref 46–106)
LV sys vol: 37 mL
MPHR: 168 {beats}/min
Peak HR: 150 {beats}/min
Percent HR: 89 %
Rest HR: 69 {beats}/min
SDS: 1
SRS: 0
SSS: 1
TID: 1.21

## 2020-01-07 MED ORDER — TECHNETIUM TC 99M TETROFOSMIN IV KIT
10.9000 | PACK | Freq: Once | INTRAVENOUS | Status: AC | PRN
  Administered 2020-01-07: 10.9 via INTRAVENOUS
  Filled 2020-01-07: qty 11

## 2020-01-07 MED ORDER — TECHNETIUM TC 99M TETROFOSMIN IV KIT
32.7000 | PACK | Freq: Once | INTRAVENOUS | Status: AC | PRN
  Administered 2020-01-07: 32.7 via INTRAVENOUS
  Filled 2020-01-07: qty 33

## 2020-01-09 ENCOUNTER — Other Ambulatory Visit: Payer: Self-pay

## 2020-01-09 ENCOUNTER — Ambulatory Visit (HOSPITAL_BASED_OUTPATIENT_CLINIC_OR_DEPARTMENT_OTHER): Payer: BC Managed Care – PPO

## 2020-01-09 ENCOUNTER — Ambulatory Visit (HOSPITAL_BASED_OUTPATIENT_CLINIC_OR_DEPARTMENT_OTHER)
Admission: RE | Admit: 2020-01-09 | Discharge: 2020-01-09 | Disposition: A | Discharge status: Home / Self Care | Payer: BC Managed Care – PPO | Source: Ambulatory Visit | Attending: Cardiology | Admitting: Cardiology

## 2020-01-09 DIAGNOSIS — R079 Chest pain, unspecified: Secondary | ICD-10-CM | POA: Insufficient documentation

## 2020-01-09 DIAGNOSIS — I10 Essential (primary) hypertension: Secondary | ICD-10-CM | POA: Diagnosis present

## 2020-01-09 DIAGNOSIS — R072 Precordial pain: Secondary | ICD-10-CM | POA: Insufficient documentation

## 2020-01-09 DIAGNOSIS — R0602 Shortness of breath: Secondary | ICD-10-CM | POA: Diagnosis present

## 2020-01-09 NOTE — Progress Notes (Signed)
  Echocardiogram 2D Echocardiogram has been performed.  Sinda Du 01/09/2020, 1:41 PM

## 2020-01-13 ENCOUNTER — Telehealth: Payer: Self-pay | Admitting: *Deleted

## 2020-01-13 NOTE — Telephone Encounter (Signed)
Left message with echo results and to call with any questions.

## 2020-01-13 NOTE — Telephone Encounter (Signed)
-----   Message from Thomasene Ripple, DO sent at 01/13/2020  9:00 AM EST ----- Normal echo

## 2020-02-27 ENCOUNTER — Other Ambulatory Visit: Payer: Self-pay

## 2020-03-02 ENCOUNTER — Ambulatory Visit: Payer: BC Managed Care – PPO | Admitting: Obstetrics and Gynecology

## 2020-03-02 ENCOUNTER — Encounter: Payer: Self-pay | Admitting: Obstetrics and Gynecology

## 2020-03-02 ENCOUNTER — Other Ambulatory Visit: Payer: Self-pay

## 2020-03-02 ENCOUNTER — Other Ambulatory Visit: Payer: Self-pay | Admitting: Family Medicine

## 2020-03-02 VITALS — BP 130/78 | HR 72 | Temp 97.3°F | Resp 10 | Ht 64.5 in | Wt 219.4 lb

## 2020-03-02 DIAGNOSIS — N761 Subacute and chronic vaginitis: Secondary | ICD-10-CM

## 2020-03-02 DIAGNOSIS — Z01419 Encounter for gynecological examination (general) (routine) without abnormal findings: Secondary | ICD-10-CM

## 2020-03-02 DIAGNOSIS — Z1231 Encounter for screening mammogram for malignant neoplasm of breast: Secondary | ICD-10-CM

## 2020-03-02 NOTE — Progress Notes (Signed)
54 y.o. G75P2012 Married [place] American female here as an old-new patient for an annual exam. Patient complains of having vaginal odor that is "fishy" and vaginal burning. Patient would also like to discuss hormone therapy.  Status post abdominal hysterectomy with bilateral salpingo-oophorectomy in 2005 for fibroids and bleeding.  States she had 7 surgery in [place].   She has used Vagifem, and this worked well, but she had insurance issues.  Her greatest concern is vaginal dryness.  She used vaginal cream also, and it was messy.  Not having hot flashes.   She is having some chest discomfort.   Dx of possible reflux.  She will have a colonoscopy and endoscopy.  Normal stress test and echocardiogram.   PCP:  Marda Stalker, PA-C   Patient's last menstrual period was 04/28/2004.           Sexually active: Yes.    The current method of family planning is status post hysterectomy.    Exercising: No.  The patient does not participate in regular exercise at present. Smoker:  no  Health Maintenance: Pap:  2016 Normal History of abnormal Pap:  no MMG:  Years ago in [place] per patient Colonoscopy:  Never -- scheduled May 2021 BMD:   never TDaP:  Unsure.  Thinks it is less than 10 years.  Gardasil:   n/a HIV: never Hep C: possibly done in the past Screening Labs: PCP   reports that she has never smoked. She has never used smokeless tobacco. She reports that she does not drink alcohol or use drugs.  Past Medical History:  Diagnosis Date  . Anxiety 01/03/2020  . Atrophic vaginitis   . B12 deficiency 01/03/2020  . Bariatric surgery status 01/03/2020  . Essential hypertension 01/03/2020  . Hypertension   . Mild acid reflux 01/03/2020  . Morbid obesity ([place]) 01/03/2020  . Postmenopausal atrophic vaginitis 01/03/2020  . Primary osteoarthritis of left knee 01/03/2020  . Stress reaction 01/03/2020  . Vitamin D deficiency 01/03/2020    Past Surgical History:  Procedure Laterality Date  .  ABDOMINAL HYSTERECTOMY    . achilles    . gallstone tubulation    . ROUX-EN-Y GASTRIC BYPASS      Current Outpatient Medications  Medication Sig Dispense Refill  . calcium-vitamin D (OSCAL WITH D) 250-125 MG-UNIT per tablet Take 1 tablet by mouth daily.    . ferrous sulfate 325 (65 FE) MG tablet Take 325 mg by mouth daily with breakfast.    . hydrochlorothiazide (HYDRODIURIL) 25 MG tablet TK 1 T PO QAM  0  . mirtazapine (REMERON) 15 MG tablet Take 15 mg by mouth at bedtime.    . Multiple Vitamins-Minerals (MULTIVITAMIN WITH MINERALS) tablet Take 1 tablet by mouth daily.    . nitroGLYCERIN (NITROSTAT) 0.4 MG SL tablet Place 1 tablet (0.4 mg total) under the tongue every 5 (five) minutes as needed. 30 tablet 3  . omeprazole (PRILOSEC) 20 MG capsule Take 20 mg by mouth daily.    Marland Kitchen PATADAY 0.2 % SOLN Apply 1 drop to eye every morning.    . RESTASIS 0.05 % ophthalmic emulsion 1 drop 2 (two) times daily.    Marland Kitchen spironolactone (ALDACTONE) 25 MG tablet Take 25 mg by mouth daily.    Marland Kitchen triamcinolone ointment (KENALOG) 0.5 % Apply 1 application topically 2 (two) times daily.    . vitamin B-12 (CYANOCOBALAMIN) 100 MCG tablet Take 100 mcg by mouth daily.    Marland Kitchen docusate sodium (COLACE) 100 MG capsule Take 100  mg by mouth 2 (two) times daily.     No current facility-administered medications for this visit.    Family History  Problem Relation Age of Onset  . Hypertension Mother   . Thyroid disease Mother   . Hypertension Father   . Heart attack Father   . Hypertension Sister   . Thyroid disease Sister   . Breast cancer Maternal Aunt     Review of Systems  Constitutional: Negative.   HENT: Negative.   Eyes: Negative.   Respiratory: Negative.   Cardiovascular: Negative.   Gastrointestinal: Negative.   Endocrine: Negative.   Genitourinary:       Vaginal odor Vaginal burning  Musculoskeletal: Negative.   Skin: Negative.   Allergic/Immunologic: Negative.   Neurological: Negative.    Hematological: Negative.   Psychiatric/Behavioral: Negative.     Exam:   BP 130/78 (BP Location: Right Arm, Patient Position: Sitting, Cuff Size: Large)   Pulse 72   Temp (!) 97.3 F (36.3 C) (Temporal)   Resp 10   Ht 5' 4.5" (1.638 m)   Wt 219 lb 6.4 oz (99.5 kg)   LMP 04/28/2004   BMI 37.08 kg/m     General appearance: alert, cooperative and appears stated age Head: normocephalic, without obvious abnormality, atraumatic Neck: no adenopathy, supple, symmetrical, trachea midline and thyroid normal to inspection and palpation Lungs: clear to auscultation bilaterally Breasts: normal appearance, no masses or tenderness, No nipple retraction or dimpling, No nipple discharge or bleeding, No axillary adenopathy Heart: regular rate and rhythm Abdomen: soft, non-tender; no masses, no organomegaly Extremities: extremities normal, atraumatic, no cyanosis or edema Skin: skin color, texture, turgor normal. No rashes or lesions Lymph nodes: cervical, supraclavicular, and axillary nodes normal. Neurologic: grossly normal  Pelvic: External genitalia:  no lesions              No abnormal inguinal nodes palpated.              Urethra:  normal appearing urethra with no masses, tenderness or lesions              Bartholins and Skenes: normal                 Vagina: normal appearing vagina with normal color and discharge, no lesions              Cervix: absent.              Pap taken: No. Bimanual Exam:  Uterus:  absent              Adnexa: no mass, fullness, tenderness              Rectal exam: Yes.  .  Confirms.              Anus:  normal sphincter tone, no lesions  Chaperone was present for exam.  Assessment:   Well woman visit with normal exam. Status post weight loss surgery.  Status post hysterectomy.  Vaginitis.  Chronic.  Has Rx for nitroglycerin SL.   Plan: Mammogram screening discussed. Self breast awareness reviewed. Pap and HR HPV as above. Guidelines for Calcium,  Vitamin D, regular exercise program including cardiovascular and weight bearing exercise. Affirm.  Consider Estring after mammogram is back and normal.  We discussed water based lubricants and cooking oils for lubrication.  Follow up annually and prn.   After visit summary provided.

## 2020-03-02 NOTE — Patient Instructions (Signed)

## 2020-03-03 LAB — VAGINITIS/VAGINOSIS, DNA PROBE
Candida Species: NEGATIVE
Gardnerella vaginalis: POSITIVE — AB
Trichomonas vaginosis: NEGATIVE

## 2020-03-04 ENCOUNTER — Ambulatory Visit
Admission: RE | Admit: 2020-03-04 | Discharge: 2020-03-04 | Disposition: A | Discharge status: Home / Self Care | Payer: BC Managed Care – PPO | Source: Ambulatory Visit | Attending: Family Medicine | Admitting: Family Medicine

## 2020-03-04 ENCOUNTER — Other Ambulatory Visit: Payer: Self-pay

## 2020-03-04 ENCOUNTER — Telehealth: Payer: Self-pay | Admitting: *Deleted

## 2020-03-04 DIAGNOSIS — Z1231 Encounter for screening mammogram for malignant neoplasm of breast: Secondary | ICD-10-CM

## 2020-03-04 MED ORDER — METRONIDAZOLE 500 MG PO TABS: 500.0000 mg | ORAL_TABLET | Freq: Two times a day (BID) | ORAL | 0 refills | Status: DC

## 2020-03-04 NOTE — Telephone Encounter (Signed)
Leda Min, RN  03/04/2020 9:19 AM EDT    Left message to call Noreene Larsson, RN at [facility] [phone].

## 2020-03-04 NOTE — Telephone Encounter (Signed)
-----   Message from Patton Salles, MD sent at 03/03/2020  1:08 PM EDT ----- Please inform of Affirm result showing bacterial vaginosis. She may treat with Flagyl 500 mg po bid for 7 days or Metrogel pv at hs for 5 nights.  Please send Rx to pharmacy of choice. ETOH precautions.

## 2020-03-04 NOTE — Telephone Encounter (Signed)
Patient is returning call to Jill. °

## 2020-03-04 NOTE — Telephone Encounter (Signed)
Spoke with patient, advised of results per Dr. Edward Jolly. Rx for flagyl to verified pharmacy. ETOH precautions reviewed. Patient verbalizes understanding and is agreeable.   Encounter closed.

## 2020-03-05 ENCOUNTER — Other Ambulatory Visit: Payer: Self-pay | Admitting: Family Medicine

## 2020-03-05 DIAGNOSIS — R928 Other abnormal and inconclusive findings on diagnostic imaging of breast: Secondary | ICD-10-CM

## 2020-03-16 ENCOUNTER — Ambulatory Visit: Payer: BC Managed Care – PPO

## 2020-03-16 ENCOUNTER — Ambulatory Visit
Admission: RE | Admit: 2020-03-16 | Discharge: 2020-03-16 | Disposition: A | Discharge status: Home / Self Care | Payer: BC Managed Care – PPO | Source: Ambulatory Visit | Attending: Family Medicine | Admitting: Family Medicine

## 2020-03-16 ENCOUNTER — Other Ambulatory Visit: Payer: Self-pay

## 2020-03-16 DIAGNOSIS — R928 Other abnormal and inconclusive findings on diagnostic imaging of breast: Secondary | ICD-10-CM

## 2020-03-26 ENCOUNTER — Ambulatory Visit (INDEPENDENT_AMBULATORY_CARE_PROVIDER_SITE_OTHER): Payer: BC Managed Care – PPO | Admitting: Obstetrics and Gynecology

## 2020-03-26 ENCOUNTER — Other Ambulatory Visit: Payer: Self-pay

## 2020-03-26 ENCOUNTER — Encounter: Payer: Self-pay | Admitting: Obstetrics and Gynecology

## 2020-03-26 ENCOUNTER — Telehealth: Payer: Self-pay | Admitting: Obstetrics and Gynecology

## 2020-03-26 VITALS — BP 116/68 | Temp 97.2°F | Ht 64.5 in | Wt 218.0 lb

## 2020-03-26 DIAGNOSIS — N952 Postmenopausal atrophic vaginitis: Secondary | ICD-10-CM | POA: Diagnosis not present

## 2020-03-26 DIAGNOSIS — N761 Subacute and chronic vaginitis: Secondary | ICD-10-CM | POA: Diagnosis not present

## 2020-03-26 DIAGNOSIS — R8290 Unspecified abnormal findings in urine: Secondary | ICD-10-CM

## 2020-03-26 LAB — POCT URINALYSIS DIPSTICK
Bilirubin, UA: NEGATIVE
Glucose, UA: NEGATIVE
Ketones, UA: NEGATIVE
Nitrite, UA: NEGATIVE
Protein, UA: NEGATIVE
Urobilinogen, UA: 0.2 E.U./dL
pH, UA: 5 (ref 5.0–8.0)

## 2020-03-26 MED ORDER — SULFAMETHOXAZOLE-TRIMETHOPRIM 800-160 MG PO TABS
1.0000 | ORAL_TABLET | Freq: Two times a day (BID) | ORAL | 0 refills | Status: DC
Start: 2020-03-26 — End: 2020-04-24

## 2020-03-26 MED ORDER — VAGIFEM 10 MCG VA TABS
1.0000 | ORAL_TABLET | VAGINAL | 12 refills | Status: AC
Start: 2020-03-26 — End: ?

## 2020-03-26 NOTE — Telephone Encounter (Signed)
Patient said "I was treated recently for bacterial vaginosis and and now having dryness and irritation".

## 2020-03-26 NOTE — Progress Notes (Signed)
vagiGYNECOLOGY  VISIT   HPI: 54 y.o.   Married  [place] American  female   [phone] with Patient's last menstrual period was 04/28/2004.   here for vaginal dryness/irritation. She was treated with Flagyl for BV on  03-03-20.  States she had some diarrhea and then loose stool with her prep for her colonoscopy.  Pressure to void, but not pain.  She has urgency to void.  No vaginal odor.   Ammonia odor to her urine.   Patient would like Rx for her vaginal dryness and did well with Vagifem name brand.  The generic option of vaginal estradiol caused vaginal irritation.   Urine Dip:1+WBCs, 1+RBCs  GYNECOLOGIC HISTORY: Patient's last menstrual period was 04/28/2004. Contraception: Hyst Menopausal hormone therapy: none Last mammogram: 03-04-20 3D/Rt.Br.poss.asymmetry, Lt.Br.Neg/density A--03/16/20 Rt.Br.Diag./asymmetry of Rt.Br.resolved/Neg/screening 46yr/BiRads1 Last pap smear:   2016 - normal.  Hysterectomy.        OB History    Gravida  3   Para  2   Term  2   Preterm      AB  1   Living  2     SAB      TAB      Ectopic      Multiple      Live Births  2              Patient Active Problem List   Diagnosis Date Noted  . Postmenopausal atrophic vaginitis 01/03/2020  . Essential hypertension 01/03/2020  . Vitamin D deficiency 01/03/2020  . Primary osteoarthritis of left knee 01/03/2020  . Morbid obesity ([facility]) 01/03/2020  . Mild acid reflux 01/03/2020  . Bariatric surgery status 01/03/2020  . B12 deficiency 01/03/2020  . Anxiety 01/03/2020  . Stress reaction 01/03/2020  . Obesity (BMI 30-39.9) 01/03/2020  . Chest pain of uncertain etiology 01/03/2020  . Abnormal EKG 01/03/2020  . Lipid screening 01/03/2020  . Shortness of breath 01/03/2020    Past Medical History:  Diagnosis Date  . Anxiety 01/03/2020  . Atrophic vaginitis   . B12 deficiency 01/03/2020  . Bariatric surgery status 01/03/2020  . Essential hypertension 01/03/2020  . Hypertension   . Mild acid  reflux 01/03/2020  . Morbid obesity ([facility]) 01/03/2020  . Postmenopausal atrophic vaginitis 01/03/2020  . Primary osteoarthritis of left knee 01/03/2020  . Stress reaction 01/03/2020  . Vitamin D deficiency 01/03/2020    Past Surgical History:  Procedure Laterality Date  . ABDOMINAL HYSTERECTOMY    . achilles    . gallstone tubulation    . ROUX-EN-Y GASTRIC BYPASS      Current Outpatient Medications  Medication Sig Dispense Refill  . calcium-vitamin D (OSCAL WITH D) 250-125 MG-UNIT per tablet Take 1 tablet by mouth daily.    . colestipol (COLESTID) 1 g tablet Take 1 g by mouth 2 (two) times daily.    Marland Kitchen docusate sodium (COLACE) 100 MG capsule Take 100 mg by mouth 2 (two) times daily.    . ferrous sulfate 325 (65 FE) MG tablet Take 325 mg by mouth daily with breakfast.    . hydrochlorothiazide (HYDRODIURIL) 25 MG tablet TK 1 T PO QAM  0  . mirtazapine (REMERON) 15 MG tablet Take 15 mg by mouth at bedtime.    . Multiple Vitamins-Minerals (MULTIVITAMIN WITH MINERALS) tablet Take 1 tablet by mouth daily.    . nitroGLYCERIN (NITROSTAT) 0.4 MG SL tablet Place 1 tablet (0.4 mg total) under the tongue every 5 (five) minutes as needed. 30 tablet 3  .  PATADAY 0.2 % SOLN Apply 1 drop to eye every morning.    . RESTASIS 0.05 % ophthalmic emulsion 1 drop 2 (two) times daily.    Marland Kitchen spironolactone (ALDACTONE) 25 MG tablet Take 25 mg by mouth daily.    Marland Kitchen triamcinolone ointment (KENALOG) 0.5 % Apply 1 application topically 2 (two) times daily.    . vitamin B-12 (CYANOCOBALAMIN) 100 MCG tablet Take 100 mcg by mouth daily.    Marland Kitchen omeprazole (PRILOSEC) 20 MG capsule Take 20 mg by mouth daily.     No current facility-administered medications for this visit.     ALLERGIES: Penicillins  Family History  Problem Relation Age of Onset  . Hypertension Mother   . Thyroid disease Mother   . Hypertension Father   . Heart attack Father   . Hypertension Sister   . Thyroid disease Sister   . Breast cancer Maternal Aunt      Social History   Socioeconomic History  . Marital status: Married    Spouse name: Not on file  . Number of children: Not on file  . Years of education: Not on file  . Highest education level: Not on file  Occupational History  . Not on file  Tobacco Use  . Smoking status: Never Smoker  . Smokeless tobacco: Never Used  Substance and Sexual Activity  . Alcohol use: No    Alcohol/week: 0.0 standard drinks  . Drug use: No  . Sexual activity: Yes    Partners: Male    Birth control/protection: Surgical    Comment: Hysterectomy  Other Topics Concern  . Not on file  Social History Narrative  . Not on file   Social Determinants of Health   Financial Resource Strain:   . Difficulty of Paying Living Expenses:   Food Insecurity:   . Worried About Programme researcher, broadcasting/film/video in the Last Year:   . Barista in the Last Year:   Transportation Needs:   . Freight forwarder (Medical):   Marland Kitchen Lack of Transportation (Non-Medical):   Physical Activity:   . Days of Exercise per Week:   . Minutes of Exercise per Session:   Stress:   . Feeling of Stress :   Social Connections:   . Frequency of Communication with Friends and Family:   . Frequency of Social Gatherings with Friends and Family:   . Attends Religious Services:   . Active Member of Clubs or Organizations:   . Attends Banker Meetings:   Marland Kitchen Marital Status:   Intimate Partner Violence:   . Fear of Current or Ex-Partner:   . Emotionally Abused:   Marland Kitchen Physically Abused:   . Sexually Abused:     Review of Systems  Skin:       Vaginal dryness/irritation     PHYSICAL EXAMINATION:    BP 116/68 (Cuff Size: Large)   Temp (!) 97.2 F (36.2 C) (Temporal)   Ht 5' 4.5" (1.638 m)   Wt 218 lb (98.9 kg)   LMP 04/28/2004   BMI 36.84 kg/m     General appearance: alert, cooperative and appears stated age   Pelvic: External genitalia:  no lesions              Urethra:  normal appearing urethra with no masses,  tenderness or lesions              Bartholins and Skenes: normal  Vagina: normal appearing vagina with normal color and discharge, no lesions              Cervix: no lesions                Bimanual Exam:  Uterus:  normal size, contour, position, consistency, mobility, non-tender              Adnexa: no mass, fullness, tenderness               Chaperone was present for exam.  ASSESSMENT  Vaginitis.  Chronic.  Atrophy.   Abnormal urine odor. Status post hysterectomy.   PLAN  Affirm.  Urine micro and cx.  Bactrim DS po bid x 3 days.  Restart Vagifem name brand.  Rx for one year.    An After Visit Summary was printed and given to the patient.  __20____ minutes face to face time of which over 50% was spent in counseling.

## 2020-03-26 NOTE — Telephone Encounter (Signed)
AEX 03/02/2020 +BV 03/04/20, treated with 7 days of Flagyl and completed course  Spoke with pt. Pt reports having vaginal dryness, irritation/soreness internal and external x 4 days. Pt reports noticing it more after colonoscopy on 4/26. Pt states had 2 days with ammonia smelling urine as well. Pt denies fever, chills, back pain, vaginal discharge and UTI sx.  Pt was SA this am, states was uncomfortable. No new partner.  Pt advised to have OV for further evaluation. Pt agreeable.  Pt scheduled as work-in today at 4:15pm per Dr Edward Jolly. Pt verbalized understanding of date and time. CPS neg.   Routing to Dr Edward Jolly for review.  Encounter closed.

## 2020-03-27 ENCOUNTER — Telehealth: Payer: Self-pay

## 2020-03-27 LAB — VAGINITIS/VAGINOSIS, DNA PROBE
Candida Species: POSITIVE — AB
Gardnerella vaginalis: NEGATIVE
Trichomonas vaginosis: NEGATIVE

## 2020-03-27 LAB — URINALYSIS, MICROSCOPIC ONLY
Bacteria, UA: NONE SEEN
Casts: NONE SEEN /lpf

## 2020-03-27 MED ORDER — FLUCONAZOLE 150 MG PO TABS
ORAL_TABLET | ORAL | 0 refills | Status: DC
Start: 2020-03-27 — End: 2020-04-24

## 2020-03-27 NOTE — Telephone Encounter (Signed)
Patient is returning call.  °

## 2020-03-27 NOTE — Telephone Encounter (Signed)
Left message for pt to return call to triage RN. 

## 2020-03-27 NOTE — Telephone Encounter (Signed)
Returned call to patient. Message given to patient as seen below from Dr. Edward Jolly and patient verbalized understanding. Patient request prescription for diflucan be sent to [facility] on [address]. Instructions on use of diflucan reviewed with patient and she verbalized understanding. Prescription for diflucan #2,0RF sent to confirmed pharmacy. Patient aware to return call to office if symptoms do not resolved after treatment.   Encounter closed.

## 2020-03-27 NOTE — Telephone Encounter (Signed)
-----   Message from Patton Salles, MD sent at 03/27/2020 11:49 AM EDT ----- Please inform patient of Affirm showing yeast.  I am recommending Diflucan 150 mg po x 1.  She may repeat in 72 hours prn.  Disp:  2, RF:  none.   The remaining testing is still in process.

## 2020-03-28 LAB — URINE CULTURE

## 2020-03-30 ENCOUNTER — Other Ambulatory Visit: Payer: Self-pay | Admitting: *Deleted

## 2020-03-30 ENCOUNTER — Telehealth: Payer: Self-pay | Admitting: *Deleted

## 2020-03-30 NOTE — Telephone Encounter (Signed)
Patient does not need to take additional Bactrim if her urinary symptoms are resolved.  OK to take the second Diflucan for her yeast infection.  Her Vagifem will help with her atrophy symptoms.

## 2020-03-30 NOTE — Telephone Encounter (Signed)
-----   Message from Patton Salles, MD sent at 03/30/2020  7:38 AM EDT ----- Please contact patient with results of UC showing E Coli UTI.  I am recommending Bactrim DS po bid x 3 days.  Please send to pharmacy of choice.

## 2020-03-30 NOTE — Progress Notes (Signed)
See telephone encounter dated 03/30/20.

## 2020-03-30 NOTE — Telephone Encounter (Signed)
Leda Min, RN  03/30/2020 10:57 AM EDT    Spoke with patient, advised of results as seen below per Dr. Edward Jolly.  Patient was prescribed Bactrim DS on 03/26/20, has completed medication. Reports urinary symptoms have resolved. Reports still having symptoms of yeast, will take2 nd diflucan on Wednesday. Advised patient I will review with Dr. Edward Jolly to confirm if additional abx needed and return call. Patient agreeable.   See telephone encounter dated 03/30/20.      Dr. Edward Jolly -is additional Bactrim Rx recommended?

## 2020-03-30 NOTE — Telephone Encounter (Signed)
Spoke with patient. Advised per Dr. Edward Jolly. Patient denies any urinary symptoms, reports mild vaginal irritation, will take second diflucan on Wednesday if symptoms not resolved. Patient will f/u with pharmacy regarding Vagifem, has not started medication. Advised patient to return call to office if not covered or if PA is required. Patient verbalizes understanding and is agreeable.   Routing to provider for final review. Patient is agreeable to disposition. Will close encounter.

## 2020-04-24 ENCOUNTER — Other Ambulatory Visit: Payer: Self-pay

## 2020-04-24 ENCOUNTER — Ambulatory Visit: Payer: BC Managed Care – PPO | Admitting: Cardiology

## 2020-04-24 ENCOUNTER — Encounter: Payer: Self-pay | Admitting: Cardiology

## 2020-04-24 VITALS — BP 120/82 | HR 62 | Ht 64.5 in | Wt 218.0 lb

## 2020-04-24 DIAGNOSIS — I10 Essential (primary) hypertension: Secondary | ICD-10-CM

## 2020-04-24 DIAGNOSIS — E669 Obesity, unspecified: Secondary | ICD-10-CM | POA: Diagnosis not present

## 2020-04-24 DIAGNOSIS — R0602 Shortness of breath: Secondary | ICD-10-CM

## 2020-04-24 NOTE — Progress Notes (Signed)
Cardiology Office Note:    Date:  04/24/2020   ID:  Tiffany Griffith, DOB 1966-02-02, MRN [id number]  PCP:  Jarrett Soho, PA-C  Cardiologist:  Thomasene Ripple, DO  Electrophysiologist:  None   Referring MD: Sigmund Hazel, MD   Chief Complaint  Patient presents with  . Follow-up    History of Present Illness:    Tiffany Griffith is a 54 y.o. female with a hx of  female with a hx of hypertension, vitamin D deficiency, obesity family history of premature coronary artery disease (father died at age  55, MI).  The patient tells me that she has not had chest pain but she has been experiencing some arm pain.  She did see her primary care doctor prior to her recent visit.  She denies any shortness of breath.  After her last visit we did a stress test which was reported to be normal.  Her echocardiogram was also normal.  No other complaints at this time.  She will be driving through [place] to see her sister.  Past Medical History:  Diagnosis Date  . Anxiety 01/03/2020  . Atrophic vaginitis   . B12 deficiency 01/03/2020  . Bariatric surgery status 01/03/2020  . Essential hypertension 01/03/2020  . Hypertension   . Mild acid reflux 01/03/2020  . Morbid obesity (HCC) 01/03/2020  . Postmenopausal atrophic vaginitis 01/03/2020  . Primary osteoarthritis of left knee 01/03/2020  . Stress reaction 01/03/2020  . Vitamin D deficiency 01/03/2020    Past Surgical History:  Procedure Laterality Date  . ABDOMINAL HYSTERECTOMY    . achilles    . gallstone tubulation    . ROUX-EN-Y GASTRIC BYPASS      Current Medications: Current Meds  Medication Sig  . calcium-vitamin D (OSCAL WITH D) 250-125 MG-UNIT per tablet Take 1 tablet by mouth daily.  . colestipol (COLESTID) 1 g tablet Take 1 g by mouth 2 (two) times daily.  . Cyanocobalamin (VITAMIN B-12) 1000 MCG/15ML LIQD Take by mouth daily.   . ferrous sulfate 325 (65 FE) MG tablet Take 325 mg by mouth daily with breakfast.  . hydrochlorothiazide (HYDRODIURIL)  25 MG tablet TK 1 T PO QAM  . Multiple Vitamins-Minerals (MULTIVITAMIN WITH MINERALS) tablet Take 1 tablet by mouth daily.  . nitroGLYCERIN (NITROSTAT) 0.4 MG SL tablet Place 1 tablet (0.4 mg total) under the tongue every 5 (five) minutes as needed.  Marland Kitchen omeprazole (PRILOSEC) 20 MG capsule Take 20 mg by mouth daily.  Marland Kitchen PATADAY 0.2 % SOLN Apply 1 drop to eye every morning.  . RESTASIS 0.05 % ophthalmic emulsion 1 drop 2 (two) times daily.  Marland Kitchen spironolactone (ALDACTONE) 25 MG tablet Take 25 mg by mouth daily.  Marland Kitchen triamcinolone ointment (KENALOG) 0.5 % Apply 1 application topically 2 (two) times daily.  Marland Kitchen VAGIFEM 10 MCG TABS vaginal tablet Place 1 tablet (10 mcg total) vaginally 2 (two) times a week. Use every night before bed for two weeks when you first begin this medicine, then after the first two weeks, begin using it twice a week.     Allergies:   Penicillins   Social History   Socioeconomic History  . Marital status: Married    Spouse name: Not on file  . Number of children: Not on file  . Years of education: Not on file  . Highest education level: Not on file  Occupational History  . Not on file  Tobacco Use  . Smoking status: Never Smoker  . Smokeless tobacco: Never Used  Substance  and Sexual Activity  . Alcohol use: No    Alcohol/week: 0.0 standard drinks  . Drug use: No  . Sexual activity: Yes    Partners: Male    Birth control/protection: Surgical    Comment: Hysterectomy  Other Topics Concern  . Not on file  Social History Narrative  . Not on file   Social Determinants of Health   Financial Resource Strain:   . Difficulty of Paying Living Expenses:   Food Insecurity:   . Worried About Programme researcher, broadcasting/film/video in the Last Year:   . Barista in the Last Year:   Transportation Needs:   . Freight forwarder (Medical):   Marland Kitchen Lack of Transportation (Non-Medical):   Physical Activity:   . Days of Exercise per Week:   . Minutes of Exercise per Session:   Stress:     . Feeling of Stress :   Social Connections:   . Frequency of Communication with Friends and Family:   . Frequency of Social Gatherings with Friends and Family:   . Attends Religious Services:   . Active Member of Clubs or Organizations:   . Attends Banker Meetings:   Marland Kitchen Marital Status:      Family History: The patient's family history includes Breast cancer in her maternal aunt; Heart attack in her father; Hypertension in her father, mother, and sister; Thyroid disease in her mother and sister.  ROS:   Review of Systems  Constitution: Negative for decreased appetite, fever and weight gain.  HENT: Negative for congestion, ear discharge, hoarse voice and sore throat.   Eyes: Negative for discharge, redness, vision loss in right eye and visual halos.  Cardiovascular: Negative for chest pain, dyspnea on exertion, leg swelling, orthopnea and palpitations.  Respiratory: Negative for cough, hemoptysis, shortness of breath and snoring.   Endocrine: Negative for heat intolerance and polyphagia.  Hematologic/Lymphatic: Negative for bleeding problem. Does not bruise/bleed easily.  Skin: Negative for flushing, nail changes, rash and suspicious lesions.  Musculoskeletal: Reported left arm pain. negative for arthritis, joint pain, muscle cramps, myalgias, neck pain and stiffness.  Gastrointestinal: Negative for abdominal pain, bowel incontinence, diarrhea and excessive appetite.  Genitourinary: Negative for decreased libido, genital sores and incomplete emptying.  Neurological: Negative for brief paralysis, focal weakness, headaches and loss of balance.  Psychiatric/Behavioral: Negative for altered mental status, depression and suicidal ideas.  Allergic/Immunologic: Negative for HIV exposure and persistent infections.    EKGs/Labs/Other Studies Reviewed:    The following studies were reviewed today:   EKG: None today  TTE IMPRESSIONS January 09, 2020 1. Left ventricular  ejection fraction, by estimation, is 65 to 70%. The left ventricle has normal function. The left ventrical has no regional wall motion abnormalities. Left ventricular diastolic parameters were normal.  2. No left atrial/left atrial appendage thrombus was detected.   FINDINGS  Left Ventricle: Left ventricular ejection fraction, by estimation, is 65  to 70%. The left ventricle has normal function. The left ventricle has no  regional wall motion abnormalities. The left ventricular internal cavity  size was normal in size. There is  no left ventricular hypertrophy. Left ventricular diastolic parameters  were normal.   Right Ventricle: The right ventricular size is normal. No increase in  right ventricular wall thickness. Right ventricular systolic function is  normal. There is normal pulmonary artery systolic pressure. The tricuspid  regurgitant velocity is 2.26 m/s, and  with an assumed right atrial pressure of 3 mmHg, the estimated right  ventricular systolic pressure is 23.4 mmHg.   Left Atrium: Left atrial size was normal in size.   Right Atrium: Right atrial size was normal in size.   Pericardium: There is no evidence of pericardial effusion.   Mitral Valve: The mitral valve is normal in structure and function. Normal  mobility of the mitral valve leaflets. No evidence of mitral valve  regurgitation. No evidence of mitral valve stenosis.   Tricuspid Valve: The tricuspid valve is normal in structure. Tricuspid  valve regurgitation is not demonstrated. No evidence of tricuspid  stenosis.   Aortic Valve: The aortic valve is normal in structure and function. Aortic  valve regurgitation is not visualized. No aortic stenosis is present.   Pulmonic Valve: The pulmonic valve was normal in structure. Pulmonic valve  regurgitation is not visualized. No evidence of pulmonic stenosis.   Aorta: The aortic root is normal in size and structure.   Venous: The inferior vena cava is normal  in size with greater than 50%  respiratory variability, suggesting right atrial pressure of 3 mmHg. The  inferior vena cava and the hepatic vein show a normal flow pattern.   IAS/Shunts: No atrial level shunt detected by color flow Doppler.   Recent Labs: 01/03/2020: BUN 12; Creatinine, Ser 1.05; Magnesium 1.7; Potassium 3.6; Sodium 140  Recent Lipid Panel No results found for: CHOL, TRIG, HDL, CHOLHDL, VLDL, LDLCALC, LDLDIRECT  Physical Exam:    VS:  BP 120/82   Pulse 62   Ht 5' 4.5" (1.638 m)   Wt 218 lb (98.9 kg)   LMP 04/28/2004   SpO2 95%   BMI 36.84 kg/m     Wt Readings from Last 3 Encounters:  04/24/20 218 lb (98.9 kg)  03/26/20 218 lb (98.9 kg)  03/02/20 219 lb 6.4 oz (99.5 kg)     GEN: Well nourished, well developed in no acute distress HEENT: Normal NECK: No JVD; No carotid bruits LYMPHATICS: No lymphadenopathy CARDIAC: S1S2 noted,RRR, no murmurs, rubs, gallops RESPIRATORY:  Clear to auscultation without rales, wheezing or rhonchi  ABDOMEN: Soft, non-tender, non-distended, +bowel sounds, no guarding. EXTREMITIES: No edema, No cyanosis, no clubbing MUSCULOSKELETAL:  No deformity  SKIN: Warm and dry NEUROLOGIC:  Alert and oriented x 3, non-focal PSYCHIATRIC:  Normal affect, good insight  ASSESSMENT:    1. Essential hypertension   2. Shortness of breath   3. Obesity (BMI 30-39.9)    PLAN:     Her blood pressure acceptable in the office today.  We will continue her current medication regimen.  We again discussed her stress test and echocardiogram results.  All of her questions were answered.  I have asked the patient to continue to monitor her symptoms for any recurrent chest pain.  Obesity-the patient understands the need to lose weight with diet and exercise. We have discussed specific strategies for this.  The patient is in agreement with the above plan. The patient left the office in stable condition.  The patient will follow up in 3 months or sooner if  needed.   Medication Adjustments/Labs and Tests Ordered: Current medicines are reviewed at length with the patient today.  Concerns regarding medicines are outlined above.  No orders of the defined types were placed in this encounter.  No orders of the defined types were placed in this encounter.   Patient Instructions  Medication Instructions:  None ordered *If you need a refill on your cardiac medications before your next appointment, please call your pharmacy*   Lab Work: None  ordered If you have labs (blood work) drawn today and your tests are completely normal, you will receive your results only by: Marland Kitchen MyChart Message (if you have MyChart) OR . A paper copy in the mail If you have any lab test that is abnormal or we need to change your treatment, we will call you to review the results.   Testing/Procedures: None ordered   Follow-Up: At [facility], you and your health needs are our priority.  As part of our continuing mission to provide you with exceptional heart care, we have created designated Provider Care Teams.  These Care Teams include your primary Cardiologist (physician) and Advanced Practice Providers (APPs -  Physician Assistants and Nurse Practitioners) who all work together to provide you with the care you need, when you need it.  We recommend signing up for the patient portal called "MyChart".  Sign up information is provided on this After Visit Summary.  MyChart is used to connect with patients for Virtual Visits (Telemedicine).  Patients are able to view lab/test results, encounter notes, upcoming appointments, etc.  Non-urgent messages can be sent to your provider as well.   To learn more about what you can do with MyChart, go to [email or web address].    Your next appointment:   3 month(s)  The format for your next appointment:   In Person  Provider:   Berniece Salines, DO   Other Instructions NA     Adopting a Healthy Lifestyle.  Know what a  healthy weight is for you (roughly BMI <25) and aim to maintain this   Aim for 7+ servings of fruits and vegetables daily   65-80+ fluid ounces of water or unsweet tea for healthy kidneys   Limit to max 1 drink of alcohol per day; avoid smoking/tobacco   Limit animal fats in diet for cholesterol and heart health - choose grass fed whenever available   Avoid highly processed foods, and foods high in saturated/trans fats   Aim for low stress - take time to unwind and care for your mental health   Aim for 150 min of moderate intensity exercise weekly for heart health, and weights twice weekly for bone health   Aim for 7-9 hours of sleep daily   When it comes to diets, agreement about the perfect plan isnt easy to find, even among the experts. Experts at the [place] developed an idea known as the Healthy Eating Plate. Just imagine a plate divided into logical, healthy portions.   The emphasis is on diet quality:   Load up on vegetables and fruits - one-half of your plate: Aim for color and variety, and remember that potatoes dont count.   Go for whole grains - one-quarter of your plate: Whole wheat, barley, wheat berries, quinoa, oats, brown rice, and foods made with them. If you want pasta, go with whole wheat pasta.   Protein power - one-quarter of your plate: Fish, chicken, beans, and nuts are all healthy, versatile protein sources. Limit red meat.   The diet, however, does go beyond the plate, offering a few other suggestions.   Use healthy plant oils, such as olive, canola, soy, corn, sunflower and peanut. Check the labels, and avoid partially hydrogenated oil, which have unhealthy trans fats.   If youre thirsty, drink water. Coffee and tea are good in moderation, but skip sugary drinks and limit milk and dairy products to one or two daily servings.   The type of carbohydrate in the  diet is more important than the amount. Some sources of carbohydrates,  such as vegetables, fruits, whole grains, and beans-are healthier than others.   Finally, stay active  Signed, Thomasene Ripple, DO  04/24/2020 10:12 AM    [facility]

## 2020-04-24 NOTE — Patient Instructions (Signed)
Medication Instructions:  None ordered *If you need a refill on your cardiac medications before your next appointment, please call your pharmacy*   Lab Work: None ordered If you have labs (blood work) drawn today and your tests are completely normal, you will receive your results only by: Marland Kitchen MyChart Message (if you have MyChart) OR . A paper copy in the mail If you have any lab test that is abnormal or we need to change your treatment, we will call you to review the results.   Testing/Procedures: None ordered   Follow-Up: At [facility], you and your health needs are our priority.  As part of our continuing mission to provide you with exceptional heart care, we have created designated Provider Care Teams.  These Care Teams include your primary Cardiologist (physician) and Advanced Practice Providers (APPs -  Physician Assistants and Nurse Practitioners) who all work together to provide you with the care you need, when you need it.  We recommend signing up for the patient portal called "MyChart".  Sign up information is provided on this After Visit Summary.  MyChart is used to connect with patients for Virtual Visits (Telemedicine).  Patients are able to view lab/test results, encounter notes, upcoming appointments, etc.  Non-urgent messages can be sent to your provider as well.   To learn more about what you can do with MyChart, go to [email or web address].    Your next appointment:   3 month(s)  The format for your next appointment:   In Person  Provider:   Thomasene Ripple, DO   Other Instructions NA

## 2020-05-27 ENCOUNTER — Telehealth: Payer: Self-pay | Admitting: *Deleted

## 2020-05-27 NOTE — Telephone Encounter (Signed)
Patient is in [facility] hold for further evaluation of right breast asymmetry on screening MMG completed on 03/04/20.   03/16/20 Dx right breast MMG completed.  RECOMMENDATION: Screening mammogram in one year.(   BI-RADS CATEGORY  1: Negative.    Patient removed from MMG hold.   Routing to Dr. Edward Jolly for final review.

## 2020-06-04 NOTE — Telephone Encounter (Signed)
Ok to remove from mammogram hold and return to routine screening.  

## 2020-06-08 NOTE — Telephone Encounter (Signed)
Encounter closed

## 2020-07-17 ENCOUNTER — Ambulatory Visit: Payer: BC Managed Care – PPO | Admitting: Cardiology

## 2020-08-27 ENCOUNTER — Other Ambulatory Visit: Payer: Self-pay

## 2020-08-27 DIAGNOSIS — Z20822 Contact with and (suspected) exposure to covid-19: Secondary | ICD-10-CM

## 2020-08-28 LAB — SARS-COV-2, NAA 2 DAY TAT

## 2020-08-28 LAB — NOVEL CORONAVIRUS, NAA: SARS-CoV-2, NAA: NOT DETECTED

## 2020-09-21 ENCOUNTER — Other Ambulatory Visit: Payer: Self-pay | Admitting: Family Medicine

## 2020-09-21 DIAGNOSIS — N6320 Unspecified lump in the left breast, unspecified quadrant: Secondary | ICD-10-CM

## 2020-10-14 ENCOUNTER — Ambulatory Visit
Admission: RE | Admit: 2020-10-14 | Discharge: 2020-10-14 | Disposition: A | Discharge status: Home / Self Care | Payer: Self-pay | Source: Ambulatory Visit | Attending: Family Medicine | Admitting: Family Medicine

## 2020-10-14 ENCOUNTER — Other Ambulatory Visit: Payer: Self-pay

## 2020-10-14 DIAGNOSIS — N6320 Unspecified lump in the left breast, unspecified quadrant: Secondary | ICD-10-CM

## 2021-02-20 ENCOUNTER — Telehealth: Payer: Self-pay | Admitting: Family

## 2021-02-20 ENCOUNTER — Telehealth: Payer: Self-pay | Admitting: Unknown Physician Specialty

## 2021-02-20 NOTE — Telephone Encounter (Signed)
Error

## 2021-02-20 NOTE — Telephone Encounter (Signed)
Called to discuss with patient about COVID-19 symptoms and the use of one of the available treatments for those with mild to moderate Covid symptoms and at a high risk of hospitalization.  Pt appears to qualify for outpatient treatment due to co-morbid conditions and/or a member of an at-risk group in accordance with the FDA Emergency Use Authorization.    Symptom onset: 3/23  Unable to reach pt - LMOM and mychart   Gabriel Cirri

## 2021-02-21 ENCOUNTER — Telehealth: Payer: Self-pay | Admitting: Adult Health

## 2021-02-21 NOTE — Telephone Encounter (Signed)
Called to discuss with patient about COVID-19 symptoms and the use of one of the available treatments for those with mild to moderate Covid symptoms and at a high risk of hospitalization.  Pt appears to qualify for outpatient treatment due to co-morbid conditions and/or a member of an at-risk group in accordance with the FDA Emergency Use Authorization.    Symptom onset: ?3/23  Vaccinated: ? Booster? Immunocompromised?  Qualifiers: yes   Unable to reach pt -Unable to leave vm . Message to MyChart sent 3/26 .   Rubye Oaks NP

## 2021-02-22 ENCOUNTER — Telehealth (HOSPITAL_COMMUNITY): Payer: Self-pay

## 2021-02-22 NOTE — Telephone Encounter (Signed)
Called to Discuss with patient about Covid symptoms and the use of the monoclonal antibody infusion for those with mild to moderate Covid symptoms and at a high risk of hospitalization.     Pt is qualified for this infusion due to co-morbid conditions and/or a member of an at-risk group.     Patient Active Problem List   Diagnosis Date Noted  . Postmenopausal atrophic vaginitis 01/03/2020  . Essential hypertension 01/03/2020  . Vitamin D deficiency 01/03/2020  . Primary osteoarthritis of left knee 01/03/2020  . Morbid obesity (HCC) 01/03/2020  . Mild acid reflux 01/03/2020  . Bariatric surgery status 01/03/2020  . B12 deficiency 01/03/2020  . Anxiety 01/03/2020  . Stress reaction 01/03/2020  . Obesity (BMI 30-39.9) 01/03/2020  . Chest pain of uncertain etiology 01/03/2020  . Abnormal EKG 01/03/2020  . Lipid screening 01/03/2020  . Shortness of breath 01/03/2020    RN LVM for patient to return call if interested.

## 2021-03-05 IMAGING — MG DIGITAL SCREENING BILAT W/ TOMO W/ CAD
8 series · 8 of 24 positions shown · non-contrast
Comparison: None.

ACR Breast Density Category a: The breast tissue is almost entirely
fatty.

CLINICAL DATA: Screening.

EXAM:
DIGITAL SCREENING BILATERAL MAMMOGRAM WITH TOMO AND CAD

[R MLO synth-2D]
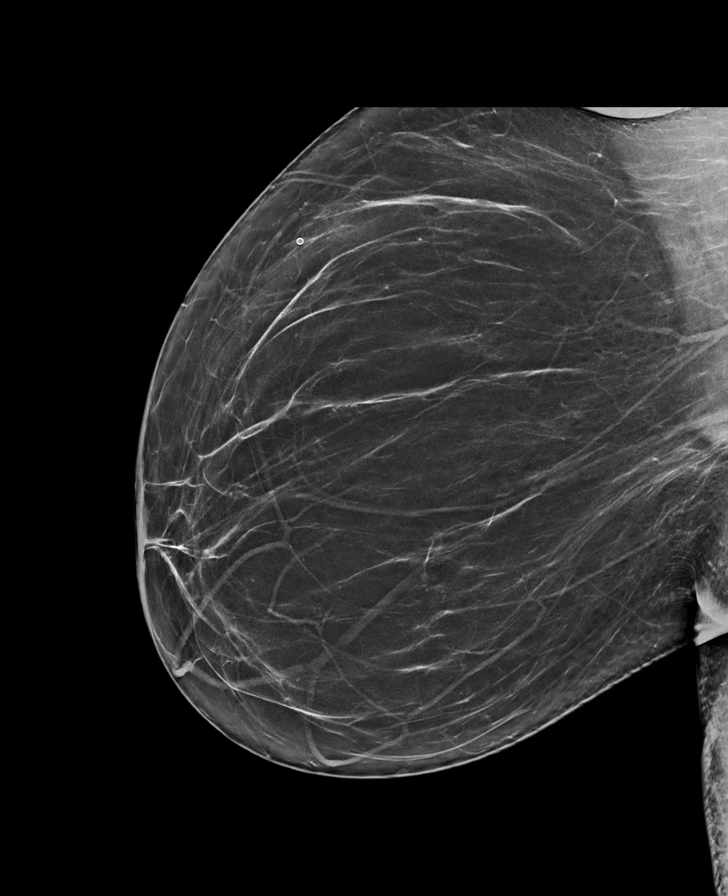

[L CC synth-2D]
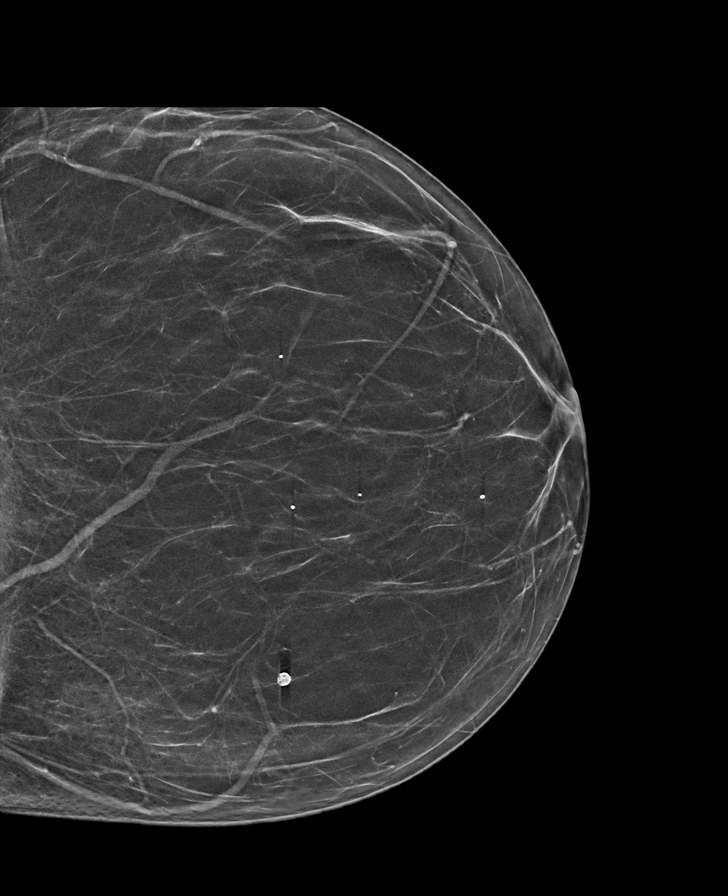

[R CC synth-2D]
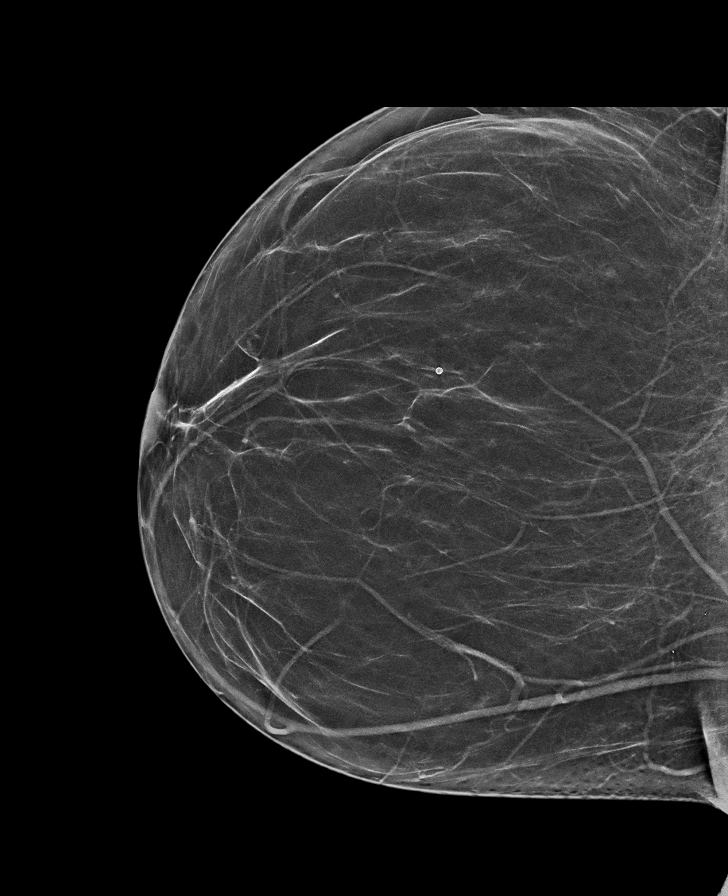

[L MLO synth-2D]
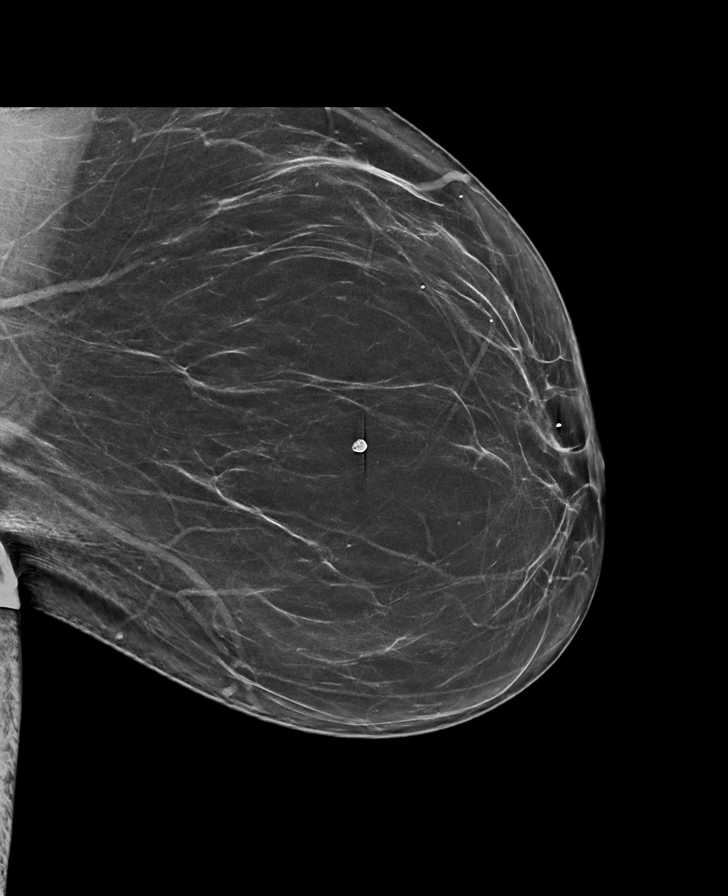

[R MLO tomo · tomo slice 41/81.0]
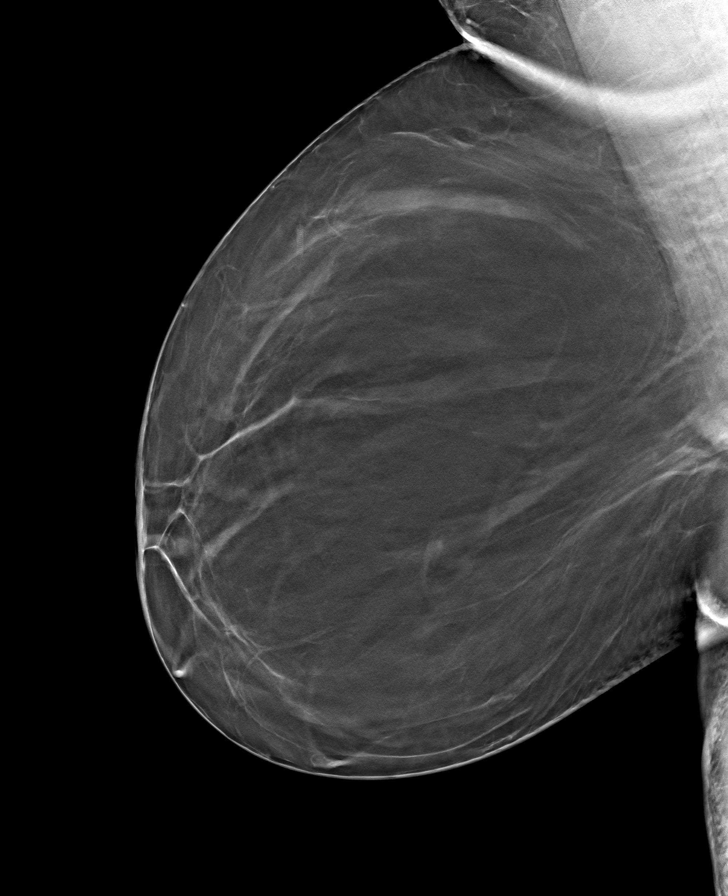

[L MLO tomo · tomo slice 41/82.0]
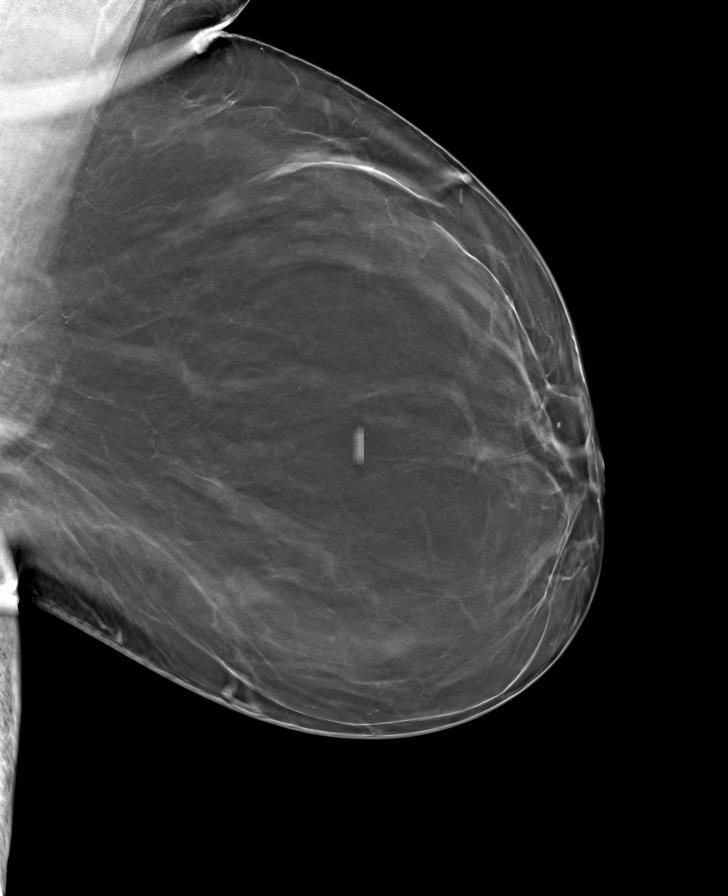

[L CC tomo · tomo slice 32/63.0]
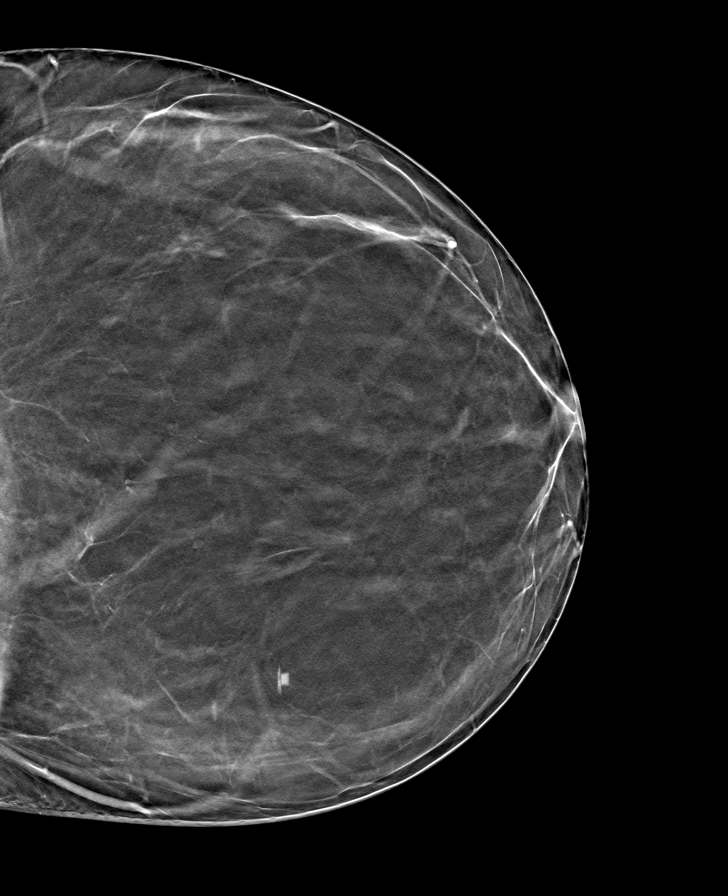

[R CC tomo · tomo slice 34/67.0]
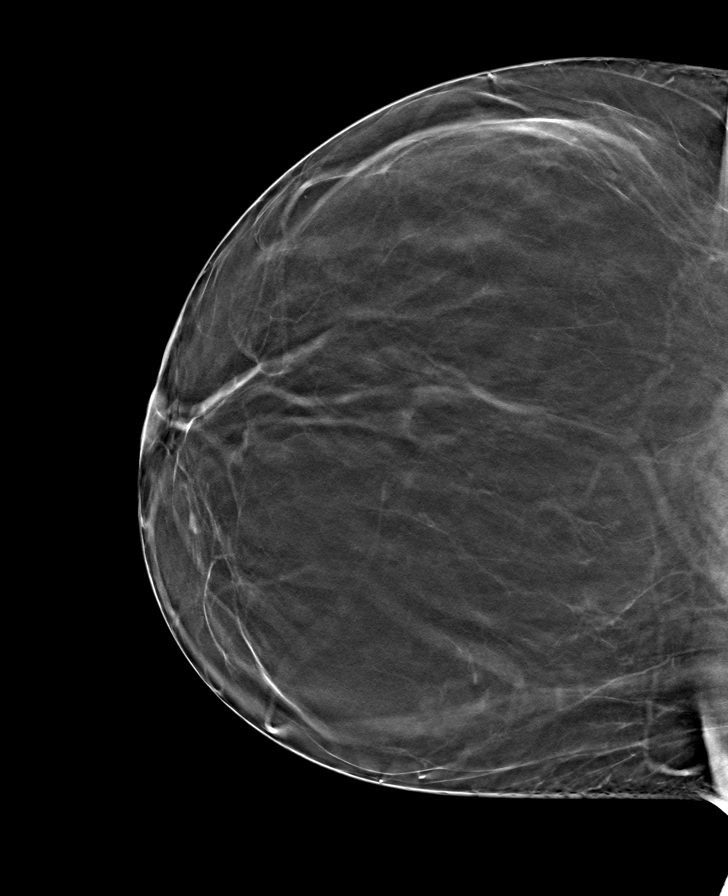

[8 of 24 positions shown; findings below may reference images not displayed]

FINDINGS: In the right breast, a possible asymmetry warrants further
evaluation. In the left breast, no findings suspicious for
malignancy. Images were processed with CAD.
IMPRESSION: Further evaluation is suggested for possible asymmetry in the right
breast.

RECOMMENDATION:
Diagnostic mammogram and possibly ultrasound of the right breast.
(Code:[id number]-U-TTL)

The patient will be contacted regarding the findings, and additional
imaging will be scheduled.

BI-RADS CATEGORY  0: Incomplete. Need additional imaging evaluation
and/or prior mammograms for comparison.

## 2021-11-03 ENCOUNTER — Ambulatory Visit
Admission: RE | Admit: 2021-11-03 | Discharge: 2021-11-03 | Disposition: A | Discharge status: Home / Self Care | Payer: 59 | Source: Ambulatory Visit | Attending: Family Medicine | Admitting: Family Medicine

## 2021-11-03 ENCOUNTER — Other Ambulatory Visit: Payer: Self-pay | Admitting: Family Medicine

## 2021-11-03 DIAGNOSIS — Z1231 Encounter for screening mammogram for malignant neoplasm of breast: Secondary | ICD-10-CM

## 2021-12-20 DIAGNOSIS — M549 Dorsalgia, unspecified: Secondary | ICD-10-CM | POA: Diagnosis not present

## 2021-12-20 DIAGNOSIS — I10 Essential (primary) hypertension: Secondary | ICD-10-CM | POA: Diagnosis not present

## 2021-12-20 DIAGNOSIS — M25561 Pain in right knee: Secondary | ICD-10-CM | POA: Diagnosis not present

## 2021-12-20 DIAGNOSIS — M25511 Pain in right shoulder: Secondary | ICD-10-CM | POA: Diagnosis not present

## 2021-12-20 DIAGNOSIS — B353 Tinea pedis: Secondary | ICD-10-CM | POA: Diagnosis not present

## 2022-02-10 DIAGNOSIS — H0288B Meibomian gland dysfunction left eye, upper and lower eyelids: Secondary | ICD-10-CM | POA: Diagnosis not present

## 2022-02-10 DIAGNOSIS — H0288A Meibomian gland dysfunction right eye, upper and lower eyelids: Secondary | ICD-10-CM | POA: Diagnosis not present

## 2022-02-10 DIAGNOSIS — H35033 Hypertensive retinopathy, bilateral: Secondary | ICD-10-CM | POA: Diagnosis not present

## 2022-02-10 DIAGNOSIS — H1045 Other chronic allergic conjunctivitis: Secondary | ICD-10-CM | POA: Diagnosis not present

## 2022-02-10 DIAGNOSIS — H16223 Keratoconjunctivitis sicca, not specified as Sjogren's, bilateral: Secondary | ICD-10-CM | POA: Diagnosis not present

## 2022-03-08 DIAGNOSIS — L723 Sebaceous cyst: Secondary | ICD-10-CM | POA: Diagnosis not present

## 2022-04-23 DIAGNOSIS — J029 Acute pharyngitis, unspecified: Secondary | ICD-10-CM | POA: Diagnosis not present

## 2022-04-23 DIAGNOSIS — J069 Acute upper respiratory infection, unspecified: Secondary | ICD-10-CM | POA: Diagnosis not present

## 2022-04-29 DIAGNOSIS — K9089 Other intestinal malabsorption: Secondary | ICD-10-CM | POA: Diagnosis not present

## 2022-05-19 DIAGNOSIS — I10 Essential (primary) hypertension: Secondary | ICD-10-CM | POA: Diagnosis not present

## 2022-06-20 DIAGNOSIS — Z880 Allergy status to penicillin: Secondary | ICD-10-CM | POA: Diagnosis not present

## 2022-06-20 DIAGNOSIS — Z6841 Body Mass Index (BMI) 40.0 and over, adult: Secondary | ICD-10-CM | POA: Diagnosis not present

## 2022-06-20 DIAGNOSIS — I10 Essential (primary) hypertension: Secondary | ICD-10-CM | POA: Diagnosis not present

## 2022-06-20 DIAGNOSIS — R609 Edema, unspecified: Secondary | ICD-10-CM | POA: Diagnosis not present

## 2022-06-20 DIAGNOSIS — Z8249 Family history of ischemic heart disease and other diseases of the circulatory system: Secondary | ICD-10-CM | POA: Diagnosis not present

## 2022-11-24 DIAGNOSIS — J101 Influenza due to other identified influenza virus with other respiratory manifestations: Secondary | ICD-10-CM | POA: Diagnosis not present

## 2022-11-24 DIAGNOSIS — B349 Viral infection, unspecified: Secondary | ICD-10-CM | POA: Diagnosis not present

## 2022-11-24 DIAGNOSIS — Z03818 Encounter for observation for suspected exposure to other biological agents ruled out: Secondary | ICD-10-CM | POA: Diagnosis not present

## 2023-02-23 DIAGNOSIS — I10 Essential (primary) hypertension: Secondary | ICD-10-CM | POA: Diagnosis not present

## 2023-02-23 DIAGNOSIS — Z880 Allergy status to penicillin: Secondary | ICD-10-CM | POA: Diagnosis not present

## 2023-02-23 DIAGNOSIS — M2550 Pain in unspecified joint: Secondary | ICD-10-CM | POA: Diagnosis not present

## 2023-02-23 DIAGNOSIS — Z6841 Body Mass Index (BMI) 40.0 and over, adult: Secondary | ICD-10-CM | POA: Diagnosis not present

## 2023-02-23 DIAGNOSIS — Z008 Encounter for other general examination: Secondary | ICD-10-CM | POA: Diagnosis not present

## 2023-02-23 DIAGNOSIS — Z8249 Family history of ischemic heart disease and other diseases of the circulatory system: Secondary | ICD-10-CM | POA: Diagnosis not present

## 2023-02-23 DIAGNOSIS — G8929 Other chronic pain: Secondary | ICD-10-CM | POA: Diagnosis not present

## 2023-03-07 ENCOUNTER — Other Ambulatory Visit: Payer: Self-pay | Admitting: Family Medicine

## 2023-03-07 DIAGNOSIS — Z1231 Encounter for screening mammogram for malignant neoplasm of breast: Secondary | ICD-10-CM

## 2023-03-16 DIAGNOSIS — E559 Vitamin D deficiency, unspecified: Secondary | ICD-10-CM | POA: Diagnosis not present

## 2023-03-16 DIAGNOSIS — E538 Deficiency of other specified B group vitamins: Secondary | ICD-10-CM | POA: Diagnosis not present

## 2023-03-16 DIAGNOSIS — R42 Dizziness and giddiness: Secondary | ICD-10-CM | POA: Diagnosis not present

## 2023-03-24 DIAGNOSIS — Z6838 Body mass index (BMI) 38.0-38.9, adult: Secondary | ICD-10-CM | POA: Diagnosis not present

## 2023-03-24 DIAGNOSIS — L0292 Furuncle, unspecified: Secondary | ICD-10-CM | POA: Diagnosis not present

## 2023-04-10 ENCOUNTER — Ambulatory Visit
Admission: RE | Admit: 2023-04-10 | Discharge: 2023-04-10 | Disposition: A | Discharge status: Home / Self Care | Payer: 59 | Source: Ambulatory Visit | Attending: Family Medicine | Admitting: Family Medicine

## 2023-04-10 DIAGNOSIS — Z1231 Encounter for screening mammogram for malignant neoplasm of breast: Secondary | ICD-10-CM | POA: Diagnosis not present

## 2023-05-24 DIAGNOSIS — K9089 Other intestinal malabsorption: Secondary | ICD-10-CM | POA: Diagnosis not present

## 2023-05-24 DIAGNOSIS — I10 Essential (primary) hypertension: Secondary | ICD-10-CM | POA: Diagnosis not present

## 2023-05-24 DIAGNOSIS — Z6838 Body mass index (BMI) 38.0-38.9, adult: Secondary | ICD-10-CM | POA: Diagnosis not present

## 2023-11-02 DIAGNOSIS — Z9884 Bariatric surgery status: Secondary | ICD-10-CM | POA: Diagnosis not present

## 2023-11-02 DIAGNOSIS — K9089 Other intestinal malabsorption: Secondary | ICD-10-CM | POA: Diagnosis not present

## 2023-11-02 DIAGNOSIS — Z9049 Acquired absence of other specified parts of digestive tract: Secondary | ICD-10-CM | POA: Diagnosis not present
